# Patient Record
Sex: Male | Born: 1955 | Race: White | Hispanic: No | Marital: Single | Smoking: Current every day smoker
Health system: Southern US, Community
[De-identification: ages and names within clinical notes are randomized; demographics above are authoritative.]

## PROBLEM LIST (undated history)

## (undated) DIAGNOSIS — B1920 Unspecified viral hepatitis C without hepatic coma: Secondary | ICD-10-CM

## (undated) DIAGNOSIS — F329 Major depressive disorder, single episode, unspecified: Secondary | ICD-10-CM

## (undated) DIAGNOSIS — M545 Low back pain, unspecified: Secondary | ICD-10-CM

## (undated) DIAGNOSIS — F99 Mental disorder, not otherwise specified: Secondary | ICD-10-CM

## (undated) DIAGNOSIS — I10 Essential (primary) hypertension: Secondary | ICD-10-CM

## (undated) DIAGNOSIS — E119 Type 2 diabetes mellitus without complications: Secondary | ICD-10-CM

## (undated) DIAGNOSIS — N50812 Left testicular pain: Secondary | ICD-10-CM

## (undated) DIAGNOSIS — K7460 Unspecified cirrhosis of liver: Secondary | ICD-10-CM

## (undated) DIAGNOSIS — G629 Polyneuropathy, unspecified: Secondary | ICD-10-CM

## (undated) DIAGNOSIS — R109 Unspecified abdominal pain: Secondary | ICD-10-CM

## (undated) DIAGNOSIS — M1990 Unspecified osteoarthritis, unspecified site: Secondary | ICD-10-CM

## (undated) DIAGNOSIS — F32A Depression, unspecified: Secondary | ICD-10-CM

## (undated) DIAGNOSIS — F419 Anxiety disorder, unspecified: Secondary | ICD-10-CM

## (undated) HISTORY — DX: Low back pain: M54.5

## (undated) HISTORY — PX: APPENDECTOMY: SHX54

## (undated) HISTORY — DX: Unspecified cirrhosis of liver: K74.60

## (undated) HISTORY — DX: Left testicular pain: N50.812

## (undated) HISTORY — DX: Unspecified abdominal pain: R10.9

## (undated) HISTORY — DX: Low back pain, unspecified: M54.50

---

## 2012-10-08 ENCOUNTER — Emergency Department (HOSPITAL_COMMUNITY): Payer: Self-pay

## 2012-10-08 ENCOUNTER — Emergency Department (HOSPITAL_COMMUNITY)
Admission: EM | Admit: 2012-10-08 | Discharge: 2012-10-08 | Disposition: A | Discharge status: Home / Self Care | Payer: Self-pay | Attending: Emergency Medicine | Admitting: Emergency Medicine

## 2012-10-08 ENCOUNTER — Encounter (HOSPITAL_COMMUNITY): Payer: Self-pay | Admitting: *Deleted

## 2012-10-08 DIAGNOSIS — F17200 Nicotine dependence, unspecified, uncomplicated: Secondary | ICD-10-CM | POA: Insufficient documentation

## 2012-10-08 DIAGNOSIS — R1032 Left lower quadrant pain: Secondary | ICD-10-CM | POA: Insufficient documentation

## 2012-10-08 DIAGNOSIS — E119 Type 2 diabetes mellitus without complications: Secondary | ICD-10-CM | POA: Insufficient documentation

## 2012-10-08 DIAGNOSIS — Z8659 Personal history of other mental and behavioral disorders: Secondary | ICD-10-CM | POA: Insufficient documentation

## 2012-10-08 DIAGNOSIS — Z8739 Personal history of other diseases of the musculoskeletal system and connective tissue: Secondary | ICD-10-CM | POA: Insufficient documentation

## 2012-10-08 DIAGNOSIS — R109 Unspecified abdominal pain: Secondary | ICD-10-CM

## 2012-10-08 DIAGNOSIS — Z8669 Personal history of other diseases of the nervous system and sense organs: Secondary | ICD-10-CM | POA: Insufficient documentation

## 2012-10-08 DIAGNOSIS — I10 Essential (primary) hypertension: Secondary | ICD-10-CM | POA: Insufficient documentation

## 2012-10-08 HISTORY — DX: Type 2 diabetes mellitus without complications: E11.9

## 2012-10-08 HISTORY — DX: Unspecified osteoarthritis, unspecified site: M19.90

## 2012-10-08 HISTORY — DX: Essential (primary) hypertension: I10

## 2012-10-08 HISTORY — DX: Polyneuropathy, unspecified: G62.9

## 2012-10-08 LAB — CBC WITH DIFFERENTIAL/PLATELET
Basophils Relative: 1 % (ref 0–1)
Eosinophils Absolute: 0.5 10*3/uL (ref 0.0–0.7)
Eosinophils Relative: 5 % (ref 0–5)
HCT: 45.5 % (ref 39.0–52.0)
Hemoglobin: 17.1 g/dL — ABNORMAL HIGH (ref 13.0–17.0)
MCH: 33 pg (ref 26.0–34.0)
MCHC: 37.6 g/dL — ABNORMAL HIGH (ref 30.0–36.0)
MCV: 87.8 fL (ref 78.0–100.0)
Monocytes Absolute: 0.8 10*3/uL (ref 0.1–1.0)
Monocytes Relative: 7 % (ref 3–12)
RDW: 12 % (ref 11.5–15.5)

## 2012-10-08 LAB — COMPREHENSIVE METABOLIC PANEL
Albumin: 4.2 g/dL (ref 3.5–5.2)
BUN: 12 mg/dL (ref 6–23)
Calcium: 10.2 mg/dL (ref 8.4–10.5)
Chloride: 95 mEq/L — ABNORMAL LOW (ref 96–112)
Creatinine, Ser: 0.53 mg/dL (ref 0.50–1.35)
Total Bilirubin: 0.6 mg/dL (ref 0.3–1.2)

## 2012-10-08 LAB — URINALYSIS, ROUTINE W REFLEX MICROSCOPIC
Bilirubin Urine: NEGATIVE
Glucose, UA: >1000 mg/dL — AB
Ketones, ur: NEGATIVE mg/dL
Protein, ur: 30 mg/dL — AB
pH: 5.5 (ref 5.0–8.0)

## 2012-10-08 LAB — URINE MICROSCOPIC-ADD ON

## 2012-10-08 LAB — LIPASE, BLOOD: Lipase: 47 U/L (ref 11–59)

## 2012-10-08 MED ORDER — ONDANSETRON HCL 4 MG/2ML IJ SOLN
4.0000 mg | Freq: Once | INTRAMUSCULAR | Status: AC
  Administered 2012-10-08: 4 mg via INTRAVENOUS
  Filled 2012-10-08: qty 2

## 2012-10-08 MED ORDER — PROMETHAZINE HCL 25 MG PO TABS: 25.0000 mg | ORAL_TABLET | Freq: Four times a day (QID) | ORAL | Status: DC | PRN

## 2012-10-08 MED ORDER — HYDROMORPHONE HCL PF 1 MG/ML IJ SOLN
1.0000 mg | Freq: Once | INTRAMUSCULAR | Status: AC
  Administered 2012-10-08: 1 mg via INTRAVENOUS
  Filled 2012-10-08: qty 1

## 2012-10-08 MED ORDER — SODIUM CHLORIDE 0.9 % IV SOLN
INTRAVENOUS | Status: DC
  Administered 2012-10-08: 16:00:00 via INTRAVENOUS

## 2012-10-08 MED ORDER — OXYCODONE-ACETAMINOPHEN 5-325 MG PO TABS: 2.0000 | ORAL_TABLET | ORAL | Status: DC | PRN

## 2012-10-08 MED ORDER — FENTANYL CITRATE 0.05 MG/ML IJ SOLN
INTRAMUSCULAR | Status: AC
  Filled 2012-10-08: qty 2

## 2012-10-08 MED ORDER — OMEPRAZOLE 20 MG PO CPDR: 20.0000 mg | DELAYED_RELEASE_CAPSULE | Freq: Every day | ORAL | Status: DC

## 2012-10-08 MED ORDER — PANTOPRAZOLE SODIUM 40 MG IV SOLR
40.0000 mg | Freq: Once | INTRAVENOUS | Status: AC
  Administered 2012-10-08: 40 mg via INTRAVENOUS
  Filled 2012-10-08: qty 40

## 2012-10-08 MED ORDER — SODIUM CHLORIDE 0.9 % IV BOLUS (SEPSIS)
1000.0000 mL | Freq: Once | INTRAVENOUS | Status: AC
  Administered 2012-10-08: 1000 mL via INTRAVENOUS

## 2012-10-08 MED ORDER — IOHEXOL 300 MG/ML  SOLN
100.0000 mL | Freq: Once | INTRAMUSCULAR | Status: AC | PRN
  Administered 2012-10-08: 100 mL via INTRAVENOUS

## 2012-10-08 MED ORDER — FENTANYL CITRATE 0.05 MG/ML IJ SOLN
50.0000 ug | Freq: Once | INTRAMUSCULAR | Status: AC
  Administered 2012-10-08: 50 ug via INTRAVENOUS

## 2012-10-08 NOTE — ED Notes (Signed)
Patient reports a history of having left lower quadrant abdominal pain, states eating causes his pain to increase.  States he has lost over 40 pounds over a period of 3 months recently.  States he has has this problem ongoing for a period of 8 months.  States he has not followed up as he should have because of some problems at home and he does not have insurance and is unable to see a gastroenterologist.

## 2012-10-08 NOTE — ED Notes (Signed)
Pt describes his pain as intermittent, sharp and stabbing at times, states that the pain is always there, no acute distress noted at this time, vitals WDL.

## 2012-10-08 NOTE — ED Notes (Addendum)
Pain LLQ, low back and down lt leg.  For several mos. No NVD.  Problems with constipation. Has been losing wt.  Lost 40 lbs over 3 mos due to this illness.    Has been being seen in [facility] and  [facility].

## 2012-10-08 NOTE — ED Provider Notes (Signed)
History  This chart was scribed for Donnetta Hutching, MD by Shari Heritage, ED Scribe. The patient was seen in room APA19/APA19. Patient's care was started at 1413.  CSN: [id number]  Arrival date & time 10/08/12  1405   First MD Initiated Contact with Patient 10/08/12 1413      Chief Complaint  Patient presents with  . Abdominal Pain     The history is provided by the patient. No language interpreter was used.    HPI Comments: Samuel [name] is a 57 y.o. male who presents to the Emergency Department complaining of recurrent, moderate to severe LLQ abdominal pain for the past 3 months. There is associated constipation that began at the same time as abdominal pain. Patient states that he has been drinking a saline solution with mild relief from constipation. Patient thinks he noticed blood in his stool a couple of times and has had a few episodes of discolored urine. Patient has been evaluated for this problem once before. He was prescribed antibiotics by a clinic in [place] to treat recurrent abdominal pain. Patient says that he was dropped from his clinic after he missed several appointments while tending to his son with lung cancer. Patient also reports 40 lbs weight loss in past 3 months. Patient has a medical history of diabetes, hypertension, arthritis, neuropathy, and anxiety.     Past Medical History  Diagnosis Date  . Diabetes mellitus without complication   . Hypertension   . Neuropathy   . Arthritis     Past Surgical History  Procedure Date  . Appendectomy     History reviewed. No pertinent family history.  History  Substance Use Topics  . Smoking status: Current Every Day Smoker  . Smokeless tobacco: Not on file  . Alcohol Use: No      Review of Systems A complete 10 system review of systems was obtained and all systems are negative except as noted in the HPI and PMH.   Allergies  Review of patient's allergies indicates no known allergies.  Home Medications  No  current outpatient prescriptions on file.  Triage Vitals: BP 150/122  Pulse 128  Temp 98.2 F (36.8 C) (Oral)  Resp 20  Ht 6\' 4"  (1.93 m)  Wt 195 lb (88.451 kg)  BMI 23.74 kg/m2  SpO2 99%  Physical Exam  Nursing note and vitals reviewed. Constitutional: He is oriented to person, place, and time. He appears well-developed and well-nourished.  HENT:  Head: Normocephalic and atraumatic.  Eyes: Conjunctivae normal and EOM are normal. Pupils are equal, round, and reactive to light.  Neck: Normal range of motion. Neck supple.  Cardiovascular: Normal rate, regular rhythm and normal heart sounds.   Pulmonary/Chest: Effort normal and breath sounds normal.  Abdominal: Soft. Bowel sounds are normal. There is tenderness in the left lower quadrant.  Musculoskeletal: Normal range of motion.  Neurological: He is alert and oriented to person, place, and time.  Skin: Skin is warm and dry.  Psychiatric: He has a normal mood and affect.    ED Course  Procedures (including critical care time) DIAGNOSTIC STUDIES: Oxygen Saturation is 99% on room air, normal by my interpretation.    COORDINATION OF CARE: 2:38 PM- Patient informed of current plan for treatment and evaluation and agrees with plan at this time. Will order CT scan w contrast and blood work, and administer IV fluids, dilaudid and zofran.  Results for orders placed during the hospital encounter of 10/08/12  URINALYSIS, ROUTINE W REFLEX MICROSCOPIC  Component Value Range   Color, Urine YELLOW  YELLOW   APPearance CLEAR  CLEAR   Specific Gravity, Urine 1.010  1.005 - 1.030   pH 5.5  5.0 - 8.0   Glucose, UA >1000 (*) NEGATIVE mg/dL   Hgb urine dipstick TRACE (*) NEGATIVE   Bilirubin Urine NEGATIVE  NEGATIVE   Ketones, ur NEGATIVE  NEGATIVE mg/dL   Protein, ur 30 (*) NEGATIVE mg/dL   Urobilinogen, UA 0.2  0.0 - 1.0 mg/dL   Nitrite NEGATIVE  NEGATIVE   Leukocytes, UA NEGATIVE  NEGATIVE  CBC WITH DIFFERENTIAL      Component  Value Range   WBC 10.2  4.0 - 10.5 K/uL   RBC 5.18  4.22 - 5.81 MIL/uL   Hemoglobin 17.1 (*) 13.0 - 17.0 g/dL   HCT 95.6  21.3 - 08.6 %   MCV 87.8  78.0 - 100.0 fL   MCH 33.0  26.0 - 34.0 pg   MCHC 37.6 (*) 30.0 - 36.0 g/dL   RDW 57.8  46.9 - 62.9 %   Platelets 187  150 - 400 K/uL   Neutrophils Relative 54  43 - 77 %   Neutro Abs 5.5  1.7 - 7.7 K/uL   Lymphocytes Relative 33  12 - 46 %   Lymphs Abs 3.4  0.7 - 4.0 K/uL   Monocytes Relative 7  3 - 12 %   Monocytes Absolute 0.8  0.1 - 1.0 K/uL   Eosinophils Relative 5  0 - 5 %   Eosinophils Absolute 0.5  0.0 - 0.7 K/uL   Basophils Relative 1  0 - 1 %   Basophils Absolute 0.1  0.0 - 0.1 K/uL  COMPREHENSIVE METABOLIC PANEL      Component Value Range   Sodium 133 (*) 135 - 145 mEq/L   Potassium 4.1  3.5 - 5.1 mEq/L   Chloride 95 (*) 96 - 112 mEq/L   CO2 28  19 - 32 mEq/L   Glucose, Bld 349 (*) 70 - 99 mg/dL   BUN 12  6 - 23 mg/dL   Creatinine, Ser 5.28  0.50 - 1.35 mg/dL   Calcium 41.3  8.4 - 24.4 mg/dL   Total Protein 8.1  6.0 - 8.3 g/dL   Albumin 4.2  3.5 - 5.2 g/dL   AST 57 (*) 0 - 37 U/L   ALT 111 (*) 0 - 53 U/L   Alkaline Phosphatase 79  39 - 117 U/L   Total Bilirubin 0.6  0.3 - 1.2 mg/dL   GFR calc non Af Amer >90  >90 mL/min   GFR calc Af Amer >90  >90 mL/min  URINE MICROSCOPIC-ADD ON      Component Value Range   Squamous Epithelial / LPF RARE  RARE   WBC, UA 0-2  <3 WBC/hpf   RBC / HPF 0-2  <3 RBC/hpf   Bacteria, UA RARE  RARE  LIPASE, BLOOD      Component Value Range   Lipase 47  11 - 59 U/L    Ct Abdomen Pelvis W Contrast  10/08/2012  *RADIOLOGY REPORT*  Clinical Data: Right lower quadrant abdominal pain.  Weight loss.  CT ABDOMEN AND PELVIS WITH CONTRAST  Technique:  Multidetector CT imaging of the abdomen and pelvis was performed following the standard protocol during bolus administration of intravenous contrast.  Contrast:  100 ml of Omnipaque-300.  Comparison: No priors.  Findings:  Lung Bases: Unremarkable.   Abdomen/Pelvis:  Calcification in the left lobe of  the liver likely represents a small calcified granuloma. The liver also has a very subtle nodular contour to it, which could be indicative of very early cirrhosis.  No definite focal hepatic lesions are noted at this time.  The appearance of the gallbladder, pancreas, spleen and bilateral adrenal glands is unremarkable.  There are multiple subcentimeter low attenuation lesions in the kidneys bilaterally which are too small to definitively characterize.  In addition, there are a couple of larger low attenuation lesions in the left kidney which are compatible with simple cysts, the largest of which measure only 1.5 cm in the upper pole. Extensive atherosclerosis throughout the abdominal and pelvic vasculature, without definite aneurysm or dissection.  Profound thickening of the duodenum is noted extending from the distal aspect of the second segment all the way through the level of the ligament of Treitz.  No definite surrounding inflammatory changes are identified at this time.  There are numerous prominent reactive sized upper abdominal and retroperitoneal lymph nodes, but no definite pathologic nodal enlargement.  Numerous colonic diverticula are identified, without surrounding inflammatory changes to suggest an acute diverticulitis at this time.  No ascites or pneumoperitoneum and no pathologic distension of bowel. Urinary bladder is moderately distended, but otherwise unremarkable in appearance.  Small bilateral inguinal hernias containing only fat. 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  Musculoskeletal: There are no aggressive appearing lytic or blastic lesions noted in the visualized portions of the skeleton.  IMPRESSION: 1.  Marked thickening of the duodenal wall which appears to be circumferential.  No definite mass is identified.  This finding is of uncertain etiology and significance.  This could  represent a duodenitis, however, there are no overt surrounding inflammatory changes noted at this time.  Alternatively, an infiltrative neoplasm such as lymphoma would be difficult to entirely exclude. Correlation with endoscopy may be warranted if clinically indicated. 2.  Very subtle nodular contour of the liver could suggest early changes of cirrhosis. 3.  Colonic diverticulosis without findings to suggest an acute diverticulitis at this time. 4.  Extensive atherosclerosis.   Original Report Authenticated By: Trudie Reed, M.D.      No results found.   No diagnosis found.    MDM  No acute abdomen.pulse is normalized.elevation glucose, AST, ALT noted.   Results of CT scan discussed with patient, his girlfriend, and gastroenterologist.   I offered to admit the patient but he preferred to go home.   Discharge meds Percocet #25, Phenergan 25 mg #20 and Prilosec 20 mg #30      I personally performed the services described in this documentation, which was scribed in my presence. The recorded information has been reviewed and is accurate.    Donnetta Hutching, MD 10/08/12 2136

## 2012-10-09 ENCOUNTER — Telehealth: Payer: Self-pay | Admitting: Gastroenterology

## 2012-10-09 NOTE — Telephone Encounter (Signed)
CONTACT PT FOR APPT WITHIN 7 DAYS FOR ABD PAIN/ABNL DUODENUM ON CT. EVENTUALLY NEED PUSH ENTEROSCOPY.

## 2012-10-09 NOTE — Telephone Encounter (Signed)
Pt has OV on 1/22 at 2 with AS

## 2012-10-22 ENCOUNTER — Encounter: Payer: Self-pay | Admitting: Gastroenterology

## 2012-10-22 ENCOUNTER — Ambulatory Visit (INDEPENDENT_AMBULATORY_CARE_PROVIDER_SITE_OTHER): Payer: Self-pay | Admitting: Gastroenterology

## 2012-10-22 VITALS — BP 136/93 | HR 115 | Temp 98.6°F | Ht 76.0 in | Wt 201.6 lb

## 2012-10-22 DIAGNOSIS — N50812 Left testicular pain: Secondary | ICD-10-CM

## 2012-10-22 DIAGNOSIS — B1920 Unspecified viral hepatitis C without hepatic coma: Secondary | ICD-10-CM

## 2012-10-22 DIAGNOSIS — R109 Unspecified abdominal pain: Secondary | ICD-10-CM

## 2012-10-22 DIAGNOSIS — N509 Disorder of male genital organs, unspecified: Secondary | ICD-10-CM

## 2012-10-22 DIAGNOSIS — K7460 Unspecified cirrhosis of liver: Secondary | ICD-10-CM

## 2012-10-22 MED ORDER — HYDROCODONE-ACETAMINOPHEN 5-500 MG PO TABS: 1.0000 | ORAL_TABLET | ORAL | Status: DC | PRN

## 2012-10-22 NOTE — Progress Notes (Signed)
Primary Care Physician:  No primary provider on file. Primary Gastroenterologist:  Dr. Darrick Penna   Chief Complaint  Patient presents with  . Abdominal Pain    HPI:   57 year old male presenting as an ED referral. Seen in ED recently for LLQ pain, constipation, wt loss. CT noted marked thickening of the duodenal wall, circumferential. No definite mass noted. Questionable duodenitis in differential but unable to exclude neoplasm. Possible early cirrhosis of liver due to subtle nodular changes, colonic diverticulosis but no diverticulitis.   Pt presents noting a 3 month hx of severe LLQ/groin pain, new changes in bowel habits causing constipation. Notes pain is constant. Pain level stays around 10. Right now 8 or 9. No fever or chills. Taking magnesium citrate, which works in 6-8 hours. If he doesn't drink it, no BM. Lost over 40 lbs in 3 months. If he eats anything solid, "tears him up", causing pain. Can't sleep more than an hour at a time due to pain. Remote hx of hematochezia. No upper abdominal pain. No vomiting, intermittent nausea. No GERD since quit eating. Eating very bland foods.   No prior colonoscopy. No prior upper endoscopy. Hx of Hep C. Notes he has never been treated for this, but he lost his space at the [facility]. Reported 2 years ago. Son age 94, passed away in Aug 18, 2023 from metastatic lung cancer. Notes hx of ETOH abuse in remote past, abstinent for past 6-7 years. No prior blood transfusion, no tattoos. 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   Pt also notes chronic "twisting" "vice-like" pain of left testicle, no edema.   Past Medical History  Diagnosis Date  . Diabetes mellitus without complication   . Hypertension   . Neuropathy   . Arthritis   . Lower back pain     Past Surgical History  Procedure Date  . Appendectomy     as child    Current Outpatient Prescriptions  Medication Sig Dispense Refill  . amLODipine (NORVASC) 5 MG tablet Take 5 mg by mouth daily.      Marland Kitchen  aspirin EC 81 MG tablet Take 81 mg by mouth daily.      . ciprofloxacin (CIPRO) 500 MG tablet Take 500 mg by mouth 2 (two) times daily.      Marland Kitchen glipiZIDE (GLUCOTROL) 10 MG tablet Take 10 mg by mouth 2 (two) times daily.      Marland Kitchen lisinopril (PRINIVIL,ZESTRIL) 20 MG tablet Take 20 mg by mouth daily.      . Menthol-Methyl Salicylate (ICY HOT EXTRA STRENGTH) 10-30 % CREA Apply 1 application topically daily as needed. For pain      . metFORMIN (GLUCOPHAGE) 500 MG tablet Take 500 mg by mouth 2 (two) times daily.      . metroNIDAZOLE (FLAGYL) 500 MG tablet Take 500 mg by mouth 2 (two) times daily.      . mineral oil liquid Take 30-45 mLs by mouth daily as needed. For bowel movements      . omeprazole (PRILOSEC) 20 MG capsule Take 1 capsule (20 mg total) by mouth daily.  30 capsule  0  . HYDROcodone-acetaminophen (VICODIN) 5-500 MG per tablet Take 1 tablet by mouth every 4 (four) hours as needed for pain.  20 tablet  0    Allergies as of 10/22/2012  . (No Known Allergies)    Family History  Problem Relation Age of Onset  . Colon cancer Neg Hx     History   Social History  . Marital  Status: Divorced    Spouse Name: N/A    Number of Children: N/A  . Years of Education: N/A   Occupational History  . Not on file.   Social History Main Topics  . Smoking status: Current Every Day Smoker -- 0.5 packs/day    Types: Cigarettes  . Smokeless tobacco: Not on file  . Alcohol Use: No     Comment: prior alcoholic  . Drug Use: No  . Sexually Active: Not on file   Other Topics Concern  . Not on file   Social History Narrative  . No narrative on file    Review of Systems: Gen: SEE HPI CV: Denies chest pain, heart palpitations, peripheral edema, syncope.  Resp: +DOE GI: SEE HPI GU : +urinary hesitancy, difficult to get started, noted as dark MS: SEE HPI Derm: Denies rash, itching, dry skin Psych: +depression/anxiety Heme: Denies bruising, bleeding, and enlarged lymph nodes.  Physical  Exam: BP 136/93  Pulse 115  Temp 98.6 F (37 C) (Oral)  Ht 6\' 4"  (1.93 m)  Wt 201 lb 9.6 oz (91.445 kg)  BMI 24.54 kg/m2 General:   Alert and oriented. Pleasant and cooperative. Well-nourished and well-developed.  Head:  Normocephalic and atraumatic. Eyes:  Without icterus, sclera clear and conjunctiva pink.  Ears:  Normal auditory acuity. Nose:  No deformity, discharge,  or lesions. Mouth:  No deformity or lesions, oral mucosa pink.  Neck:  Supple, without mass or thyromegaly. Lungs:  Clear to auscultation bilaterally. No wheezes, rales, or rhonchi. No distress.  Heart:  S1, S2 present without murmurs appreciated.  Abdomen:  +BS, soft, LLQ TTP and non-distended. No HSM noted. No guarding or rebound. No masses appreciated.  Rectal:  Deferred  Msk:  Symmetrical without gross deformities. Normal posture. Extremities:  Without clubbing or edema. Neurologic:  Alert and  oriented x4;  grossly normal neurologically. Skin:  Intact without significant lesions or rashes. Cervical Nodes:  No significant cervical adenopathy. Psych:  Alert and cooperative. Normal mood and affect.

## 2012-10-22 NOTE — Patient Instructions (Addendum)
Start taking Linzess each morning, 30 minutes before breakfast. This is for constipation.  I have ordered bloodwork to be done once the cone assistance is completed.   I have also ordered an ultrasound of your scrotum and a colonoscopy/upper endoscopy.   Please seek urgent medical attention if any of your symptoms worsen.  U/S is scheduled for Friday Jan 24th at 10:00 am and the patient is aware  Patient has been scheduled for TCS/EGD with Propofol w/SLF on Tuesday 11/04/12 and he is aware/LW

## 2012-10-23 ENCOUNTER — Telehealth: Payer: Self-pay

## 2012-10-23 DIAGNOSIS — R109 Unspecified abdominal pain: Secondary | ICD-10-CM | POA: Insufficient documentation

## 2012-10-23 DIAGNOSIS — N50812 Left testicular pain: Secondary | ICD-10-CM

## 2012-10-23 DIAGNOSIS — B1920 Unspecified viral hepatitis C without hepatic coma: Secondary | ICD-10-CM | POA: Insufficient documentation

## 2012-10-23 DIAGNOSIS — K7460 Unspecified cirrhosis of liver: Secondary | ICD-10-CM

## 2012-10-23 HISTORY — DX: Left testicular pain: N50.812

## 2012-10-23 HISTORY — DX: Unspecified abdominal pain: R10.9

## 2012-10-23 HISTORY — DX: Unspecified cirrhosis of liver: K74.60

## 2012-10-23 MED ORDER — LINACLOTIDE 145 MCG PO CAPS: 145.0000 ug | ORAL_CAPSULE | Freq: Every day | ORAL | Status: DC

## 2012-10-23 NOTE — Assessment & Plan Note (Signed)
Possible early cirrhosis noted on CT. Pt with hx ETOH abuse in past but sober for at least 6-7 years. Reportedly Hep C positive, no prior treatment. 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 Will check AFP today, PT/INR. EGD in near future to assess for varices at time of TCS. Needs Hep A and B vaccinations.

## 2012-10-23 NOTE — Assessment & Plan Note (Signed)
Noted hx of Hep C, not treated. Likely contributing to possible cirrhosis, hx of ETOH abuse as well. Check RNA and genotype. AFP today. Refer for treatment in future.

## 2012-10-23 NOTE — Telephone Encounter (Signed)
T/C from [place] at [facility] in [place]. She said they no longer have the Vicodin 5/500 and they have to do the 5/325 now. I told her I will check with Gerrit Halls, NP and give her a call back. ( Sending pager message to Tobi Bastos since she is at the hospital this morning).

## 2012-10-23 NOTE — Telephone Encounter (Signed)
Yes this is fine.

## 2012-10-23 NOTE — Assessment & Plan Note (Signed)
57 year old male with LLQ/groin pain and change in bowel habits to significant constipation. Wt loss of 40 lbs reportedly. Associated nausea, remote hx of hematochezia. CT on file without evidence of diverticulitis. HOWEVER, marked thickening of duodenal wall, circumferential noted. Possible duodenitis but unable to exclude neoplasm. Pt has never had an upper endoscopy or colonoscopy.   Start Linzess 145 mcg daily. Samples provided.  Proceed with colonoscopy and upper endoscopy with Dr. Darrick Penna in the near future. The risks, benefits, and alternatives have been discussed in detail with the patient. They state understanding and desire to proceed.  PROPOFOL DUE TO HX OF ETOH ABUSE At time of consultation, pending [place] assistance.  To ED if worsening of symptoms.

## 2012-10-23 NOTE — Telephone Encounter (Signed)
Routing to Ginger who talked to the pharmacist.

## 2012-10-23 NOTE — Assessment & Plan Note (Signed)
Pt denies edema, but notes chronic "vice-like" discomfort. [place] ordered. To ED if severe pain, edema.

## 2012-10-23 NOTE — Telephone Encounter (Signed)
I called Vickie and told her it was ok to give him 5-325 per AS

## 2012-10-23 NOTE — Progress Notes (Signed)
No PCP on file 

## 2012-10-24 ENCOUNTER — Telehealth: Payer: Self-pay

## 2012-10-24 ENCOUNTER — Other Ambulatory Visit: Payer: Self-pay | Admitting: Gastroenterology

## 2012-10-24 ENCOUNTER — Ambulatory Visit (HOSPITAL_COMMUNITY)
Admission: RE | Admit: 2012-10-24 | Discharge: 2012-10-24 | Disposition: A | Discharge status: Home / Self Care | Payer: Self-pay | Source: Ambulatory Visit | Attending: Gastroenterology | Admitting: Gastroenterology

## 2012-10-24 DIAGNOSIS — N50812 Left testicular pain: Secondary | ICD-10-CM

## 2012-10-24 DIAGNOSIS — N509 Disorder of male genital organs, unspecified: Secondary | ICD-10-CM | POA: Insufficient documentation

## 2012-10-24 DIAGNOSIS — N433 Hydrocele, unspecified: Secondary | ICD-10-CM | POA: Insufficient documentation

## 2012-10-24 NOTE — Telephone Encounter (Signed)
Routing again to Gerrit Halls, NP.

## 2012-10-24 NOTE — Telephone Encounter (Signed)
PLEASE CALL [facility] radiology. I did not order a US OF THE SCROTUM. THEY NEED TO NOTIFY THE MD WHO ORDERED THE TEST.

## 2012-10-24 NOTE — Telephone Encounter (Signed)
The test was ordered by Gerrit Halls, NP with Jonette Eva as the Primary Gastroenterologist. The message was routed to Dr. Darrick Penna in the absence of Gerrit Halls, NP, today.

## 2012-10-24 NOTE — Telephone Encounter (Signed)
FORWARD TO ANNA SAMS.

## 2012-10-24 NOTE — Telephone Encounter (Signed)
T/c from [place] at [facility]. Pt's [place] of Scrotum showed small bilateral hydroceles and otherwise unremarkable.

## 2012-10-27 ENCOUNTER — Other Ambulatory Visit: Payer: Self-pay | Admitting: Gastroenterology

## 2012-10-27 ENCOUNTER — Telehealth: Payer: Self-pay | Admitting: Gastroenterology

## 2012-10-27 ENCOUNTER — Encounter (HOSPITAL_COMMUNITY): Payer: Self-pay | Admitting: Pharmacy Technician

## 2012-10-27 MED ORDER — PEG 3350-KCL-NA BICARB-NACL 420 G PO SOLR: 4000.0000 mL | ORAL | Status: DC

## 2012-10-27 NOTE — Telephone Encounter (Signed)
I ordered it due to complaints of scrotal pain.  I have put a result note in epic: no significant abnormalities.

## 2012-10-27 NOTE — Telephone Encounter (Signed)
See note

## 2012-10-27 NOTE — Telephone Encounter (Signed)
Called and informed pt. He said he is having a little BM daily since he started the Linzess once a day. It is not what it should be, but it is some of a BM daily. He said he understands about the pain med  It is just that he has hurt so in his testicles for 3 months and said he has lost 40 lbs recently and he can't sleep at night.

## 2012-10-27 NOTE — Telephone Encounter (Signed)
Called pt in reference to his [place] report. He said the pain medication is not helping and he is in a lot of pain. Please advise!

## 2012-10-27 NOTE — Progress Notes (Signed)
Quick Note:  LATE ENTRY: Called pt this Am and gave results of the [place]. ( See separate phone note in reference to pain meds ). ______

## 2012-10-27 NOTE — Telephone Encounter (Signed)
Patient is calling asking for more pain meds he states that he is still in a lot of pain and he does not have any more medication, he is scheduled for TCS/EGD with Propofol w/SLF on 02/04

## 2012-10-27 NOTE — Telephone Encounter (Signed)
Will not provide any more pain medication. When was last BM? Is he taking Linzess yet?

## 2012-10-28 NOTE — Telephone Encounter (Signed)
Pt returned call. He said that he feels the Linzess 145 mcg is working well enough. He has some BM daily, but he is not eating real good. He does not have a PCP and I encouraged him to find one. He asked again, " Will she not give me anything for pain", and I told him, Tobi Bastos said No. He said " well, that's Ok, I'll just end up in the ED again.  "

## 2012-10-28 NOTE — Telephone Encounter (Signed)
Pt was made aware yesterday

## 2012-10-28 NOTE — Telephone Encounter (Signed)
Called. LMOM for a return call. ( No samples of Linzess 290 mcg) Will see if pt wants a prescription.

## 2012-10-28 NOTE — Progress Notes (Signed)
Quick Note:  Ultrasound of scrotum without acute issues. ______

## 2012-10-28 NOTE — Telephone Encounter (Signed)
He can trial Linzess 290 mcg daily. I had given him 145 mcg daily. This may help with more productive BMs. Does he have a PCP? He really needs to get established with a primary care provider. His CT showed bilateral inguinal hernias but only fat-containing. This shouldn't be causing the discomfort.

## 2012-10-29 ENCOUNTER — Encounter (HOSPITAL_COMMUNITY): Admission: RE | Admit: 2012-10-29 | Payer: Self-pay | Source: Ambulatory Visit

## 2012-10-30 NOTE — Telephone Encounter (Signed)
Noted  

## 2012-10-31 NOTE — Patient Instructions (Addendum)
TREMAR WICKENS  10/31/2012   Your procedure is scheduled on:  11/04/12  Report to [facility] at [phone] AM.  Call this number if you have problems the morning of surgery: [phone]   Remember:   Do not eat food or drink liquids after midnight.   Take these medicines the morning of surgery with A SIP OF WATER: prilosec, norvasc, lisinopril   Do not wear jewelry, make-up or nail polish.  Do not wear lotions, powders, or perfumes. You may wear deodorant.  Do not shave 48 hours prior to surgery. Men may shave face and neck.  Do not bring valuables to the hospital.  Contacts, dentures or bridgework may not be worn into surgery.  Leave suitcase in the car. After surgery it may be brought to your room.  For patients admitted to the hospital, checkout time is 11:00 AM the day of  discharge.   Patients discharged the day of surgery will not be allowed to drive  home.  Name and phone number of your driver: family  Special Instructions: N/A   Please read over the following fact sheets that you were given: Anesthesia Post-op Instructions and Care and Recovery After Surgery   PATIENT INSTRUCTIONS POST-ANESTHESIA  IMMEDIATELY FOLLOWING SURGERY:  Do not drive or operate machinery for the first twenty four hours after surgery.  Do not make any important decisions for twenty four hours after surgery or while taking narcotic pain medications or sedatives.  If you develop intractable nausea and vomiting or a severe headache please notify your doctor immediately.  FOLLOW-UP:  Please make an appointment with your surgeon as instructed. You do not need to follow up with anesthesia unless specifically instructed to do so.  WOUND CARE INSTRUCTIONS (if applicable):  Keep a dry clean dressing on the anesthesia/puncture wound site if there is drainage.  Once the wound has quit draining you may leave it open to air.  Generally you should leave the bandage intact for twenty four hours unless there is drainage.  If the  epidural site drains for more than 36-48 hours please call the anesthesia department.  QUESTIONS?:  Please feel free to call your physician or the hospital operator if you have any questions, and they will be happy to assist you.      Esophagogastroduodenoscopy This is an endoscopic procedure (a procedure that uses a device like a flexible telescope) that allows your caregiver to view the upper stomach and small bowel. This test allows your caregiver to look at the esophagus. The esophagus carries food from your mouth to your stomach. They can also look at your duodenum. This is the first part of the small intestine that attaches to the stomach. This test is used to detect problems in the bowel such as ulcers and inflammation. PREPARATION FOR TEST Nothing to eat after midnight the day before the test. NORMAL FINDINGS Normal esophagus, stomach, and duodenum. Ranges for normal findings may vary among different laboratories and hospitals. You should always check with your doctor after having lab work or other tests done to discuss the meaning of your test results and whether your values are considered within normal limits. MEANING OF TEST  Your caregiver will go over the test results with you and discuss the importance and meaning of your results, as well as treatment options and the need for additional tests if necessary. OBTAINING THE TEST RESULTS It is your responsibility to obtain your test results. Ask the lab or department performing the test when and  how you will get your results. Document Released: 01/18/2005 Document Revised: 12/10/2011 Document Reviewed: 08/27/2008 [facility] Patient Information 2013 [place], [place]. Colonoscopy A colonoscopy is an exam to evaluate your entire colon. In this exam, your colon is cleansed. A long fiberoptic tube is inserted through your rectum and into your colon. The fiberoptic scope (endoscope) is a long bundle of enclosed and very flexible fibers. These  fibers transmit light to the area examined and send images from that area to your caregiver. Discomfort is usually minimal. You may be given a drug to help you sleep (sedative) during or prior to the procedure. This exam helps to detect lumps (tumors), polyps, inflammation, and areas of bleeding. Your caregiver may also take a small piece of tissue (biopsy) that will be examined under a microscope. LET YOUR CAREGIVER KNOW ABOUT:   Allergies to food or medicine.  Medicines taken, including vitamins, herbs, eyedrops, over-the-counter medicines, and creams.  Use of steroids (by mouth or creams).  Previous problems with anesthetics or numbing medicines.  History of bleeding problems or blood clots.  Previous surgery.  Other health problems, including diabetes and kidney problems.  Possibility of pregnancy, if this applies. BEFORE THE PROCEDURE   A clear liquid diet may be required for 2 days before the exam.  Ask your caregiver about changing or stopping your regular medications.  Liquid injections (enemas) or laxatives may be required.  A large amount of electrolyte solution may be given to you to drink over a short period of time. This solution is used to clean out your colon.  You should be present 60 minutes prior to your procedure or as directed by your caregiver. AFTER THE PROCEDURE   If you received a sedative or pain relieving medication, you will need to arrange for someone to drive you home.  Occasionally, there is a little blood passed with the first bowel movement. Do not be concerned. FINDING OUT THE RESULTS OF YOUR TEST Not all test results are available during your visit. If your test results are not back during the visit, make an appointment with your caregiver to find out the results. Do not assume everything is normal if you have not heard from your caregiver or the medical facility. It is important for you to follow up on all of your test results. HOME CARE  INSTRUCTIONS   It is not unusual to pass moderate amounts of gas and experience mild abdominal cramping following the procedure. This is due to air being used to inflate your colon during the exam. Walking or a warm pack on your belly (abdomen) may help.  You may resume all normal meals and activities after sedatives and medicines have worn off.  Only take over-the-counter or prescription medicines for pain, discomfort, or fever as directed by your caregiver. Do not use aspirin or blood thinners if a biopsy was taken. Consult your caregiver for medicine usage if biopsies were taken. SEEK IMMEDIATE MEDICAL CARE IF:   You have a fever.  You pass large blood clots or fill a toilet with blood following the procedure. This may also occur 10 to 14 days following the procedure. This is more likely if a biopsy was taken.  You develop abdominal pain that keeps getting worse and cannot be relieved with medicine. Document Released: 09/14/2000 Document Revised: 12/10/2011 Document Reviewed: 04/29/2008 [facility] Patient Information 2013 [place], [place].

## 2012-11-03 ENCOUNTER — Other Ambulatory Visit: Payer: Self-pay

## 2012-11-03 ENCOUNTER — Encounter (HOSPITAL_COMMUNITY)
Admission: RE | Admit: 2012-11-03 | Discharge: 2012-11-03 | Disposition: A | Discharge status: Home / Self Care | Payer: Self-pay | Source: Ambulatory Visit | Attending: Gastroenterology | Admitting: Gastroenterology

## 2012-11-03 ENCOUNTER — Telehealth: Payer: Self-pay

## 2012-11-03 ENCOUNTER — Encounter (HOSPITAL_COMMUNITY): Payer: Self-pay

## 2012-11-03 HISTORY — DX: Anxiety disorder, unspecified: F41.9

## 2012-11-03 HISTORY — DX: Depression, unspecified: F32.A

## 2012-11-03 HISTORY — DX: Unspecified viral hepatitis C without hepatic coma: B19.20

## 2012-11-03 HISTORY — DX: Major depressive disorder, single episode, unspecified: F32.9

## 2012-11-03 HISTORY — DX: Mental disorder, not otherwise specified: F99

## 2012-11-03 LAB — BASIC METABOLIC PANEL
BUN: 13 mg/dL (ref 6–23)
CO2: 26 mEq/L (ref 19–32)
Chloride: 95 mEq/L — ABNORMAL LOW (ref 96–112)
Glucose, Bld: 372 mg/dL — ABNORMAL HIGH (ref 70–99)
Potassium: 4.1 mEq/L (ref 3.5–5.1)

## 2012-11-03 LAB — PROTIME-INR: Prothrombin Time: 13.4 seconds (ref 11.6–15.2)

## 2012-11-03 NOTE — Telephone Encounter (Signed)
Pt came by with questions about his prep Soledad Gerlach is taking care of that). He also requested samples of Linzess 145 mcg and samples of that were given.

## 2012-11-04 ENCOUNTER — Ambulatory Visit (HOSPITAL_COMMUNITY): Payer: Self-pay | Admitting: Anesthesiology

## 2012-11-04 ENCOUNTER — Encounter (HOSPITAL_COMMUNITY): Payer: Self-pay | Admitting: Anesthesiology

## 2012-11-04 ENCOUNTER — Encounter (HOSPITAL_COMMUNITY): Payer: Self-pay | Admitting: *Deleted

## 2012-11-04 ENCOUNTER — Encounter (HOSPITAL_COMMUNITY)
Admission: RE | Disposition: A | Discharge status: Home Health | Payer: Self-pay | Source: Ambulatory Visit | Attending: Gastroenterology

## 2012-11-04 ENCOUNTER — Ambulatory Visit (HOSPITAL_COMMUNITY)
Admission: RE | Admit: 2012-11-04 | Discharge: 2012-11-04 | Disposition: A | Discharge status: Home Health | Payer: Self-pay | Source: Ambulatory Visit | Attending: Gastroenterology | Admitting: Gastroenterology

## 2012-11-04 DIAGNOSIS — I10 Essential (primary) hypertension: Secondary | ICD-10-CM | POA: Insufficient documentation

## 2012-11-04 DIAGNOSIS — K2980 Duodenitis without bleeding: Secondary | ICD-10-CM

## 2012-11-04 DIAGNOSIS — K648 Other hemorrhoids: Secondary | ICD-10-CM

## 2012-11-04 DIAGNOSIS — Z1211 Encounter for screening for malignant neoplasm of colon: Secondary | ICD-10-CM | POA: Insufficient documentation

## 2012-11-04 DIAGNOSIS — K2940 Chronic atrophic gastritis without bleeding: Secondary | ICD-10-CM | POA: Insufficient documentation

## 2012-11-04 DIAGNOSIS — K7460 Unspecified cirrhosis of liver: Secondary | ICD-10-CM | POA: Insufficient documentation

## 2012-11-04 DIAGNOSIS — Z79899 Other long term (current) drug therapy: Secondary | ICD-10-CM | POA: Insufficient documentation

## 2012-11-04 DIAGNOSIS — K2990 Gastroduodenitis, unspecified, without bleeding: Secondary | ICD-10-CM

## 2012-11-04 DIAGNOSIS — K5730 Diverticulosis of large intestine without perforation or abscess without bleeding: Secondary | ICD-10-CM

## 2012-11-04 DIAGNOSIS — K222 Esophageal obstruction: Secondary | ICD-10-CM

## 2012-11-04 DIAGNOSIS — Z01812 Encounter for preprocedural laboratory examination: Secondary | ICD-10-CM | POA: Insufficient documentation

## 2012-11-04 DIAGNOSIS — K2970 Gastritis, unspecified, without bleeding: Secondary | ICD-10-CM

## 2012-11-04 DIAGNOSIS — E119 Type 2 diabetes mellitus without complications: Secondary | ICD-10-CM | POA: Insufficient documentation

## 2012-11-04 DIAGNOSIS — D126 Benign neoplasm of colon, unspecified: Secondary | ICD-10-CM

## 2012-11-04 HISTORY — PX: ESOPHAGOGASTRODUODENOSCOPY (EGD) WITH PROPOFOL: SHX5813

## 2012-11-04 HISTORY — PX: POLYPECTOMY: SHX5525

## 2012-11-04 HISTORY — PX: COLONOSCOPY WITH PROPOFOL: SHX5780

## 2012-11-04 HISTORY — PX: BIOPSY: SHX5522

## 2012-11-04 LAB — GLUCOSE, CAPILLARY
Glucose-Capillary: 320 mg/dL — ABNORMAL HIGH (ref 70–99)
Glucose-Capillary: 344 mg/dL — ABNORMAL HIGH (ref 70–99)

## 2012-11-04 SURGERY — COLONOSCOPY WITH PROPOFOL
Anesthesia: Monitor Anesthesia Care

## 2012-11-04 MED ORDER — FENTANYL CITRATE 0.05 MG/ML IJ SOLN
INTRAMUSCULAR | Status: AC
  Filled 2012-11-04: qty 2

## 2012-11-04 MED ORDER — FENTANYL CITRATE 0.05 MG/ML IJ SOLN
25.0000 ug | INTRAMUSCULAR | Status: DC | PRN
  Administered 2012-11-04 (×2): 25 ug via INTRAVENOUS

## 2012-11-04 MED ORDER — INSULIN REGULAR HUMAN 100 UNIT/ML IJ SOLN
5.0000 [IU] | Freq: Once | INTRAMUSCULAR | Status: AC
  Administered 2012-11-04: 5 [IU] via SUBCUTANEOUS
  Filled 2012-11-04: qty 0.05

## 2012-11-04 MED ORDER — ONDANSETRON HCL 4 MG/2ML IJ SOLN
4.0000 mg | Freq: Once | INTRAMUSCULAR | Status: AC
  Administered 2012-11-04: 4 mg via INTRAVENOUS

## 2012-11-04 MED ORDER — PROPOFOL 10 MG/ML IV BOLUS
INTRAVENOUS | Status: DC | PRN
  Administered 2012-11-04: 10 mg via INTRAVENOUS

## 2012-11-04 MED ORDER — PROPOFOL 10 MG/ML IV EMUL
INTRAVENOUS | Status: AC
  Filled 2012-11-04: qty 20

## 2012-11-04 MED ORDER — OMEPRAZOLE 20 MG PO CPDR: DELAYED_RELEASE_CAPSULE | ORAL | Status: DC

## 2012-11-04 MED ORDER — PROPOFOL INFUSION 10 MG/ML OPTIME
INTRAVENOUS | Status: DC | PRN
  Administered 2012-11-04 (×2): via INTRAVENOUS
  Administered 2012-11-04: 100 ug/kg/min via INTRAVENOUS
  Administered 2012-11-04 (×2): via INTRAVENOUS

## 2012-11-04 MED ORDER — MIDAZOLAM HCL 2 MG/2ML IJ SOLN
INTRAMUSCULAR | Status: AC
  Filled 2012-11-04: qty 2

## 2012-11-04 MED ORDER — FENTANYL CITRATE 0.05 MG/ML IJ SOLN: 25.0000 ug | INTRAMUSCULAR | Status: DC | PRN

## 2012-11-04 MED ORDER — INSULIN REGULAR HUMAN 100 UNIT/ML IJ SOLN
5.0000 [IU] | Freq: Once | INTRAMUSCULAR | Status: AC
  Administered 2012-11-04: 5 [IU] via INTRAVENOUS
  Filled 2012-11-04: qty 0.05

## 2012-11-04 MED ORDER — LACTATED RINGERS IV SOLN
INTRAVENOUS | Status: DC
  Administered 2012-11-04: 08:00:00 via INTRAVENOUS

## 2012-11-04 MED ORDER — STERILE WATER FOR IRRIGATION IR SOLN
Status: DC | PRN
  Administered 2012-11-04: 09:00:00

## 2012-11-04 MED ORDER — GLYCOPYRROLATE 0.2 MG/ML IJ SOLN
INTRAMUSCULAR | Status: AC
  Filled 2012-11-04: qty 1

## 2012-11-04 MED ORDER — GLYCOPYRROLATE 0.2 MG/ML IJ SOLN
0.2000 mg | Freq: Once | INTRAMUSCULAR | Status: AC
  Administered 2012-11-04: 0.2 mg via INTRAVENOUS

## 2012-11-04 MED ORDER — MIDAZOLAM HCL 2 MG/2ML IJ SOLN
1.0000 mg | INTRAMUSCULAR | Status: DC | PRN
  Administered 2012-11-04 (×2): 2 mg via INTRAVENOUS

## 2012-11-04 MED ORDER — ONDANSETRON HCL 4 MG/2ML IJ SOLN: 4.0000 mg | Freq: Once | INTRAMUSCULAR | Status: DC | PRN

## 2012-11-04 MED ORDER — ONDANSETRON HCL 4 MG/2ML IJ SOLN
INTRAMUSCULAR | Status: AC
  Filled 2012-11-04: qty 2

## 2012-11-04 MED ORDER — BUTAMBEN-TETRACAINE-BENZOCAINE 2-2-14 % EX AERO
1.0000 | INHALATION_SPRAY | Freq: Once | CUTANEOUS | Status: AC
  Administered 2012-11-04: 1 via TOPICAL
  Filled 2012-11-04: qty 56

## 2012-11-04 MED ORDER — FENTANYL CITRATE 0.05 MG/ML IJ SOLN
INTRAMUSCULAR | Status: DC | PRN
  Administered 2012-11-04: 50 ug via INTRAVENOUS

## 2012-11-04 MED ORDER — WATER FOR IRRIGATION, STERILE IR SOLN
Status: DC | PRN
  Administered 2012-11-04: 1000 mL

## 2012-11-04 SURGICAL SUPPLY — 23 items
BLOCK BITE 60FR ADLT L/F BLUE (MISCELLANEOUS) ×2 IMPLANT
ELECT REM PT RETURN 9FT ADLT (ELECTROSURGICAL)
ELECTRODE REM PT RTRN 9FT ADLT (ELECTROSURGICAL) IMPLANT
FCP BXJMBJMB 240X2.8X (CUTTING FORCEPS)
FLOOR PAD 36X40 (MISCELLANEOUS) ×2
FORCEPS BIOP RAD 4 LRG CAP 4 (CUTTING FORCEPS) ×2 IMPLANT
FORCEPS BIOP RJ4 240 W/NDL (CUTTING FORCEPS)
FORCEPS BXJMBJMB 240X2.8X (CUTTING FORCEPS) IMPLANT
INJECTOR/SNARE I SNARE (MISCELLANEOUS) IMPLANT
LUBRICANT JELLY 4.5OZ STERILE (MISCELLANEOUS) ×2 IMPLANT
MANIFOLD NEPTUNE II (INSTRUMENTS) ×2 IMPLANT
NEEDLE SCLEROTHERAPY 25GX240 (NEEDLE) IMPLANT
PAD FLOOR 36X40 (MISCELLANEOUS) ×1 IMPLANT
PROBE APC STR FIRE (PROBE) IMPLANT
PROBE INJECTION GOLD (MISCELLANEOUS)
PROBE INJECTION GOLD 7FR (MISCELLANEOUS) IMPLANT
SNARE ROTATE MED OVAL 20MM (MISCELLANEOUS) ×2 IMPLANT
SNARE SHORT THROW 13M SML OVAL (MISCELLANEOUS) ×2 IMPLANT
SYR 50ML LL SCALE MARK (SYRINGE) ×2 IMPLANT
TRAP SPECIMEN MUCOUS 40CC (MISCELLANEOUS) ×2 IMPLANT
TUBING ENDO SMARTCAP PENTAX (MISCELLANEOUS) ×2 IMPLANT
TUBING IRRIGATION ENDOGATOR (MISCELLANEOUS) ×2 IMPLANT
WATER STERILE IRR 1000ML POUR (IV SOLUTION) ×4 IMPLANT

## 2012-11-04 NOTE — Transfer of Care (Signed)
Immediate Anesthesia Transfer of Care Note  Patient: Samuel [name]  Procedure(s) Performed: Procedure(s) (LRB) with comments: COLONOSCOPY WITH PROPOFOL (N/A) - in cecum at 0859, end at 1009 total time = 1 hour 10 minutes ESOPHAGOGASTRODUODENOSCOPY (EGD) WITH PROPOFOL (N/A) - start at 10:17 BIOPSY (N/A) POLYPECTOMY (N/A)  Patient Location: PACU  Anesthesia Type:MAC  Level of Consciousness: awake  Airway & Oxygen Therapy: Patient Spontanous Breathing  Post-op Assessment: Report given to PACU RN  Post vital signs: Reviewed  Complications: No apparent anesthesia complications

## 2012-11-04 NOTE — Op Note (Signed)
[facility] [address] [place], [address]   COLONOSCOPY PROCEDURE REPORT  PATIENT: Samuel Samuel [name], Samuel [name]  MR#: [id number] BIRTHDATE: 12-Nov-1955 , 56  yrs. old GENDER: Male ENDOSCOPIST: Jonette Eva, MD REFERRED BY: PROCEDURE DATE:  11/04/2012 PROCEDURE:   Colonoscopy with cold biopsy polypectomy and Colonoscopy with snare polypectomy INDICATIONS:Average risk patient for colon cancer. MEDICATIONS: MAC sedation, administered by CRNA DESCRIPTION OF PROCEDURE:    Physical exam was performed.  Informed consent was obtained from the patient after explaining the benefits, risks, and alternatives to procedure.  The patient was connected to monitor and placed in left lateral position. Continuous oxygen was provided by nasal cannula and IV medicine administered through an indwelling cannula.  After administration of sedation and rectal exam, the patients rectum was intubated and the     colonoscope was advanced under direct visualization to the ileum.  The scope was removed slowly by carefully examining the color, texture, anatomy, and integrity mucosa on the way out.  The patient was recovered in endoscopy and discharged home in satisfactory condition.    COLON FINDINGS: Multiple smooth sessile polyps measuring 3-9 mm in size were found at the cecum, in the ascending colon, transverse colon, descending colon, and sigmoid colon.  A polypectomy was performed with cold forceps and using snare cautery.  , Mild diverticulosis was noted in the transverse colon, descending colon, and sigmoid colon.  , and Small internal hemorrhoids were found.  PREP QUALITY: good. CECAL W/D TIME: 69 minutes COMPLICATIONS: None  ENDOSCOPIC IMPRESSION: 1.   34 COLON polyps REMOVED 2.   Mild diverticulosis was noted in the transverse colon, descending colon, and sigmoid colon 3.   Small internal hemorrhoids  RECOMMENDATIONS: STRICTLY AVOID ASPIRIN, BC/GOODY POWDERS, IBUPROFEN/MOTRIN,  OR NAPROXEN/ALEVE FOR 7 DAYS.  RESTART FEB 11. AVOID ITEMS THAT TRIGGER GASTRITIS. FOLLOW A HIGH FIBER/LOW FAT DIET.  AVOID ITEMS THAT CAUSE BLOATING.  BIOPSY RESULTS WILL BE BACK IN 7 DAYS. FOLLOW UP IN 3 MOS. Next colonoscopy in 1-3 years WITH 1.5 HR TIME SLOT.  ALL FIRST DEGREE RELATIVES NEED A COLONOSCOPY AT AGE 10 & then every 5 years.       _______________________________ Rosalie DoctorJonette Eva, MD 11/04/2012 10:49 AM     PATIENT NAME:  Samuel Samuel [name], Samuel [name] MR#: [id number]

## 2012-11-04 NOTE — Anesthesia Postprocedure Evaluation (Signed)
  Anesthesia Post-op Note  Patient: JAYMIN WALN  Procedure(s) Performed: Procedure(s) (LRB) with comments: COLONOSCOPY WITH PROPOFOL (N/A) - in cecum at 0859, end at 1009 total time = 1 hour 10 minutes ESOPHAGOGASTRODUODENOSCOPY (EGD) WITH PROPOFOL (N/A) - start at 10:17 BIOPSY (N/A) POLYPECTOMY (N/A)  Patient Location: PACU  Anesthesia Type:MAC  Level of Consciousness: awake, alert  and oriented  Airway and Oxygen Therapy: Patient Spontanous Breathing and Patient connected to face mask oxygen  Post-op Pain: none  Post-op Assessment: Post-op Vital signs reviewed, Patient's Cardiovascular Status Stable, Respiratory Function Stable, Patent Airway and No signs of Nausea or vomiting  Post-op Vital Signs: Reviewed and stable  Complications: No apparent anesthesia complications

## 2012-11-04 NOTE — H&P (Signed)
Primary Care Physician:  No primary provider on file. Primary Gastroenterologist:  Dr. Darrick Penna  Pre-Procedure History & Physical: HPI:  Samuel [name] is a 57 y.o. male here for  SCREENING FOR COLON CA AND VARICES.  Past Medical History  Diagnosis Date  . Diabetes mellitus without complication   . Hypertension   . Neuropathy   . Arthritis   . Lower back pain   . Hepatitis C   . Mental disorder   . Depression   . Anxiety     Past Surgical History  Procedure Date  . Appendectomy     as child    Prior to Admission medications   Medication Sig Start Date End Date Taking? Authorizing Provider  ciprofloxacin (CIPRO) 500 MG tablet Take 500 mg by mouth 2 (two) times daily. 09/01/12  Yes Historical Provider, MD  Linaclotide Karlene Einstein) 145 MCG CAPS Take 1 capsule (145 mcg total) by mouth daily. 10/23/12  Yes Nira Retort, NP  Menthol-Methyl Salicylate (ICY HOT EXTRA STRENGTH) 10-30 % CREA Apply 1 application topically daily as needed. For pain   Yes Historical Provider, MD  metroNIDAZOLE (FLAGYL) 500 MG tablet Take 500 mg by mouth 2 (two) times daily. 09/01/12  Yes Historical Provider, MD  polyethylene glycol-electrolytes (TRILYTE) 420 G solution Take 4,000 mLs by mouth as directed. 10/27/12  Yes West Bali, MD  amLODipine (NORVASC) 5 MG tablet Take 5 mg by mouth daily.    Historical Provider, MD  aspirin EC 81 MG tablet Take 81 mg by mouth daily.    Historical Provider, MD  glipiZIDE (GLUCOTROL) 10 MG tablet Take 10 mg by mouth 2 (two) times daily.    Historical Provider, MD  HYDROcodone-acetaminophen (VICODIN) 5-500 MG per tablet Take 1 tablet by mouth every 4 (four) hours as needed for pain. 10/22/12   Nira Retort, NP  lisinopril (PRINIVIL,ZESTRIL) 20 MG tablet Take 20 mg by mouth daily.    Historical Provider, MD  metFORMIN (GLUCOPHAGE) 500 MG tablet Take 500 mg by mouth 2 (two) times daily.    Historical Provider, MD  mineral oil liquid Take 30-45 mLs by mouth daily as needed. For  bowel movements    Historical Provider, MD  omeprazole (PRILOSEC) 20 MG capsule Take 1 capsule (20 mg total) by mouth daily. 10/08/12   Donnetta Hutching, MD    Allergies as of 10/27/2012  . (No Known Allergies)    Family History  Problem Relation Age of Onset  . Colon cancer Neg Hx   . Colon polyps Neg Hx   . Lung cancer Son     History   Social History  . Marital Status: Divorced    Spouse Name: N/A    Number of Children: N/A  . Years of Education: N/A   Occupational History  . Not on file.   Social History Main Topics  . Smoking status: Current Every Day Smoker -- 0.5 packs/day for 40 years    Types: Cigarettes  . Smokeless tobacco: Not on file  . Alcohol Use: No     Comment: prior alcoholic  . Drug Use: No     Comment: used marijuana for pain one time last week  . Sexually Active: Not on file   Other Topics Concern  . Not on file   Social History Narrative  . No narrative on file    Review of Systems: See HPI, otherwise negative ROS   Physical Exam: BP 165/95  Temp 98.2 F (36.8 C) (Oral)  Resp  22  SpO2 94% General:   Alert,  pleasant and cooperative in NAD Head:  Normocephalic and atraumatic. Neck:  Supple; Lungs:  Clear throughout to auscultation.    Heart:  Regular rate and rhythm. Abdomen:  Soft, nontender and nondistended. Normal bowel sounds, without guarding, and without rebound.   Neurologic:  Alert and  oriented x4;  grossly normal neurologically.  Impression/Plan:     SCREENING FOR COLON CA AND VARICES  PLAN:  1.EGD/TCS TODAY

## 2012-11-04 NOTE — Op Note (Signed)
[facility] [address] [place], [address]   ENDOSCOPY PROCEDURE REPORT  PATIENT: Samuel [name], Samuel [name]  MR#: [id number] BIRTHDATE: 1956-07-28 , 56  yrs. old GENDER: Male ENDOSCOPIST: Jonette Eva, MD REFERRED BY: PROCEDURE DATE: 11/04/2012 PROCEDURE:   EGD w/ biopsy INDICATIONS:Barrett's screening.   Screening for varices. PT DENIES DYSPHAGIA. 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 I PERSONALLY REVIEW JAN 2014 CT WITH DR. Tyron Russell TODAY. MEDICATIONS: MAC sedation, administered by CRNA TOPICAL ANESTHETIC:   Cetacaine Spray DESCRIPTION OF PROCEDURE:     Physical exam was performed.  Informed consent was obtained from the patient after explaining the benefits, risks, and alternatives to the procedure.  The patient was connected to the monitor and placed in the left lateral position.  Continuous oxygen was provided by nasal cannula and IV medicine administered through an indwelling cannula.  After administration of sedation, the patients esophagus was intubated and the     endoscope was advanced under direct visualization to the second portion of the duodenum.  The scope was removed slowly by carefully examining the color, texture, anatomy, and integrity of the mucosa on the way out.  The patient was recovered in endoscopy and discharged home in satisfactory condition.   ESOPHAGUS: Linear erosions at the GE junction.  UNABLE TO APPRECIATE BARRETT'S ESOPHAGUS.   A stricture was found at the gastroesophageal junction.  The stenosis was traversable with the endoscope.   A medium sized hiatal hernia was noted.   STOMACH: Moderate erosive gastritis (inflammation) was found in the entire examined stomach.  Multiple biopsies were performed using cold forceps.   DUODENUM: Moderate duodenal inflammation was found in the bulb and second portion of the duodenum. COMPLICATIONS:   None  ENDOSCOPIC IMPRESSION: 1.   Linear erosions at the GE junction.  UNABLE TO  APPRECIATE BARRETT'S ESOPHAGUS. 2.   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   MODERATE gastritis. 4.   MODERATE DUODENITIS likely accounts for findings on CT.  RECOMMENDATIONS: STRICTLY AVOID ASPIRIN, BC/GOODY POWDERS, IBUPROFEN/MOTRIN, OR NAPROXEN/ALEVE FOR 7 DAYS.  RESTART FEB 11. FOLLOW RECOMMENDATIONS FOR CONTROLLING REFLUX. CONTINUE OMEPRAZOLE.  TAKE 30 MINUTES PRIOR TO BREAKFAST AND SUPPER.  AVOID ITEMS THAT TRIGGER GASTRITIS. FOLLOW A HIGH FIBER/LOW FAT DIET.  AVOID ITEMS THAT CAUSE BLOATING.  BIOPSY RESULTS WILL BE BACK IN 7 DAYS. FOLLOW UP IN 3 MOS. REPEAT CT TO CONFIRM DUODENAL WALL IS NL, IF NOT PT WILL NEED ENTEROSCOPY. NEXT UPPER ENDOSCOPY IN 3 YEARS TO SCREEN FOR BARRETT'S AND VARICES.   REPEAT EXAM:   _______________________________ Rosalie DoctorJonette Eva, MD 11/04/2012 10:58 AM       PATIENT NAME:  Samuel [name] MR#: [id number]

## 2012-11-04 NOTE — Telephone Encounter (Signed)
REVIEWED.  

## 2012-11-04 NOTE — Anesthesia Preprocedure Evaluation (Signed)
Anesthesia Evaluation  Patient identified by MRN, date of birth, ID band Patient awake    Reviewed: Allergy & Precautions, H&P , NPO status , Patient's Chart, lab work & pertinent test results  History of Anesthesia Complications Negative for: history of anesthetic complications  Airway Mallampati: I TM Distance: >3 FB     Dental  (+) Edentulous Upper and Edentulous Lower   Pulmonary Current Smoker, PE: am cough. breath sounds clear to auscultation        Cardiovascular hypertension, Pt. on medications Rhythm:Regular Rate:Normal     Neuro/Psych PSYCHIATRIC DISORDERS Anxiety Depression    GI/Hepatic (+)     substance abuse  alcohol use and marijuana use, Hepatitis -, C  Endo/Other  diabetes, Poorly Controlled, Type 2, Oral Hypoglycemic Agents  Renal/GU      Musculoskeletal   Abdominal   Peds  Hematology   Anesthesia Other Findings   Reproductive/Obstetrics                           Anesthesia Physical Anesthesia Plan  ASA: III  Anesthesia Plan: MAC   Post-op Pain Management:    Induction: Intravenous  Airway Management Planned: Simple Face Mask  Additional Equipment:   Intra-op Plan:   Post-operative Plan:   Informed Consent: I have reviewed the patients History and Physical, chart, labs and discussed the procedure including the risks, benefits and alternatives for the proposed anesthesia with the patient or authorized representative who has indicated his/her understanding and acceptance.     Plan Discussed with:   Anesthesia Plan Comments:         Anesthesia Quick Evaluation

## 2012-11-04 NOTE — OR Nursing (Signed)
Samuel [name] called leigh ann at dr [facility] office to get a consult for Samuel [name] for assessment of hepatitis and prostate

## 2012-11-04 NOTE — Telephone Encounter (Signed)
Referrals have been sent to [facility] and the Hep C clinic in [place] and they will contact the patient for dates & times

## 2012-11-04 NOTE — Telephone Encounter (Addendum)
PT NEEDS REFERRAL TO UROLOGY FOR LOWER ABD PAIN/SCTOTAL PAIN. REFER PT TO HCV CLINIC IN [place]. PT IS UNINSURED.

## 2012-11-05 ENCOUNTER — Telehealth: Payer: Self-pay

## 2012-11-05 ENCOUNTER — Encounter (HOSPITAL_COMMUNITY): Payer: Self-pay | Admitting: Gastroenterology

## 2012-11-05 NOTE — Telephone Encounter (Signed)
PLEASE CALL PT'S FAMILY.  HE HAD 34 POLYPS REMOVED. HE MAY SEE SOME BLOOD IN HIS STOOL. IF HE CONTINUES TO SEE BLOOD IN HIS STOOL HE SHOULD GO TO THE ED. HE SHOULD FOLLOW A LOW RESIDUE DIET FOR THE NEXT 3 DAYS.  Low Fiber and Residue Restricted Diet A low fiber diet restricts foods that contain carbohydrates that are not digested in the small intestine. A diet containing about 10 g of fiber is considered low fiber. The diet needs to be individualized to suit patient tolerances and preferences and to avoid unnecessary restrictions. Generally, the foods emphasized in a low fiber diet have no skins or seeds. They may have been processed to remove bran, germ, or husks. Cooking may not necessarily eliminate the fiber. Cooking may, in fact, enable a greater quantity of fiber to be consumed in a lesser volume. Legumes and nuts are also restricted. The term low residue has also been used to describe low fiber diets, although the two are not the same. Residue refers to any substance that adds to bowel (colonic) contents, such as sloughed cells and intestinal bacteria, in addition to fiber. Residue-containing foods, prunes and prune juice, milk, and connective tissue from meats may also need to be eliminated. It is important to eliminate these foods during sudden (acute) attacks of inflammatory bowel disease, when there is a partial obstruction due to another reason, or when minimal fecal output is desired. When these problems are gone, a more normal diet may be used.  PURPOSE  Reduce stool weight and volume.   CHOOSING FOODS Check labels, especially on foods from the starch list. Often, dietary fiber content is listed with the Nutrition Facts panel.  Breads and Starches  Allowed: White, [place], and pita breads, plain rolls, buns, or sweet rolls, doughnuts, waffles, pancakes, bagels. Plain muffins, sweet breads, biscuits, matzoth. Flour. Soda, saltine, or graham crackers. Pretzels, rusks, melba toast,  zwieback. Cooked cereals: cornmeal, farina, cream cereals. Dry cereals: refined corn, wheat, rice, and oat cereals (check label). Potatoes prepared any way without skins, refined macaroni, spaghetti, noodles, refined rice.   Avoid: Bread, rolls, or crackers made with whole-wheat, multigrains, rye, bran seeds, nuts, or coconut. Corn tortillas, table-shells. Corn chips, tortilla chips. Cereals containing whole-grains, multigrains, bran, coconut, nuts, or raisins. Cooked or dry oatmeal. Coarse wheat cereals, granola. Cereals advertised as "high fiber." Potato skins. Whole-grain pasta, wild or brown rice. Popcorn.  Vegetables  Allowed:  Strained tomato and vegetable juices. Fresh: tender lettuce, cucumber, cabbage, spinach, bean sprouts. Cooked, canned: asparagus, bean sprouts, cut green or wax beans, cauliflower, pumpkin, beets, mushrooms, olives, spinach, yellow squash, tomato, tomato sauce (no seeds), zucchini (peeled), turnips. Canned sweet potatoes. Small amounts of celery, onion, radish, and green pepper may be used. Keep servings limited to  cup.   Avoid: Fresh, cooked, or canned: artichokes, baked beans, beet greens, broccoli, Brussels sprouts, French-style green beans, corn, kale, legumes, peas, sweet potatoes. Cooked: green or red cabbage, spinach. Avoid large servings of any vegetables.  Fruit  Allowed:  All fruit juices except prune juice. Cooked or canned: apricots applesauce, cantaloupe, cherries, grapefruit, grapes, kiwi, mandarin oranges, peaches, pears, fruit cocktail, pineapple, plums, watermelon. Fresh: banana, grapes, cantaloupe, avocado, cherries, pineapple, grapefruit, kiwi, nectarines, peaches, oranges, blueberries, plums. Keep servings limited to  cup or 1 piece.   Avoid: Fresh: apple with or without skin, apricots, mango, pears, raspberries, strawberries. Prune juice, stewed or dried prunes. Dried fruits, raisins, dates. Avoid large servings of all fresh fruits.  Meat and  Meat  Substitutes  Allowed:  Ground or well-cooked tender beef, ham, veal, lamb, pork, or poultry. Eggs, plain cheese. Fish, oysters, shrimp, lobster, other seafood. Liver, organ meats.   Avoid: Tough, fibrous meats with gristle. Peanut butter, smooth or chunky. Cheese with seeds, nuts, or other foods not allowed. Nuts, seeds, legumes, dried peas, beans, lentils.  Milk  Allowed:  All milk products except those not allowed. Milk and milk product consumption should be minimal when low residue is desired.   Avoid: Yogurt that contains nuts or seeds.  Soups and Combination Foods  Allowed:  Bouillon, broth, or cream soups made from allowed foods. Any strained soup. Casseroles or mixed dishes made with allowed foods.   Avoid: Soups made from vegetables that are not allowed or that contain other foods not allowed.  Desserts and Sweets  Allowed:  Plain cakes and cookies, pie made with allowed fruit, pudding, custard, cream pie. Gelatin, fruit, ice, sherbet, frozen ice pops. Ice cream, ice milk without nuts. Plain hard candy, honey, jelly, molasses, syrup, sugar, chocolate syrup, gumdrops, marshmallows.   Avoid: Desserts, cookies, or candies that contain nuts, peanut butter, or dried fruits. Jams, preserves with seeds, marmalade.  Fats and Oils  Allowed:  Margarine, butter, cream, mayonnaise, salad oils, plain salad dressings made from allowed foods. Plain gravy, crisp bacon without rind.   Avoid: Seeds, nuts, olives. Avocados.  Beverages  Allowed:  All, except those listed to avoid.   Avoid: Fruit juices with high pulp, prune juice.  Condiments  Allowed:  Ketchup, mustard, horseradish, vinegar, cream sauce, cheese sauce, cocoa powder. Spices in moderation: allspice, basil, bay leaves, celery powder or leaves, cinnamon, cumin powder, curry powder, ginger, mace, marjoram, onion or garlic powder, oregano, paprika, parsley flakes, ground pepper, rosemary, sage, savory, tarragon, thyme, turmeric.    Avoid: Coconut, pickles.  SAMPLE MEAL PLAN The following menu is provided as a sample. Your daily menu plans will vary. Be sure to include a minimum of the following each day in order to provide essential nutrients for the adult:  Starch/Bread/Cereal Group, 6 servings.   Fruit/Vegetable Group, 5 servings.   Meat/Meat Substitute Group, 2 servings.   Milk/Milk Substitute Group, 2 servings.  A serving is equal to  cup for fruits, vegetables, and cooked cereals or 1 piece for foods such as a piece of bread, 1 orange, or 1 apple. For dry cereals and crackers, use serving sizes listed on the label. Combination foods may count as full or partial servings from various food groups. Fats, desserts, and sweets may be added to the meal plan after the requirements for essential nutrients are met. SAMPLE MENU Breakfast   cup orange juice.   1 boiled egg.   1 slice white toast.   Margarine.    cup cornflakes.   1 cup milk.   Beverage.  Lunch   cup chicken noodle soup.   2 to 3 oz sliced roast beef.   2 slices seedless rye bread.   Mayonnaise.    cup tomato juice.   1 small banana.   Beverage.  Dinner  3 oz baked chicken.    cup scalloped potatoes.    cup cooked beets.   White dinner roll.   Margarine.    cup canned peaches.

## 2012-11-05 NOTE — Telephone Encounter (Signed)
Called and informed Jasmine December. Went over the diet. Ginger is going to e-mail the diet to her.

## 2012-11-05 NOTE — Telephone Encounter (Signed)
E-mailed Pt the diet.

## 2012-11-05 NOTE — Telephone Encounter (Signed)
T/C from [place], friend of pt. She said pt had a BM this morning and it was all liquid and had a large amount of bright red blood. He is having a little abdominal pain, but mostly like before his procedure. ( Procedures done yesterday) Please advise!

## 2012-11-06 ENCOUNTER — Encounter: Payer: Self-pay | Admitting: Gastroenterology

## 2012-11-06 ENCOUNTER — Telehealth: Payer: Self-pay

## 2012-11-06 ENCOUNTER — Other Ambulatory Visit: Payer: Self-pay

## 2012-11-06 ENCOUNTER — Telehealth: Payer: Self-pay | Admitting: Gastroenterology

## 2012-11-06 DIAGNOSIS — B1920 Unspecified viral hepatitis C without hepatic coma: Secondary | ICD-10-CM

## 2012-11-06 DIAGNOSIS — K625 Hemorrhage of anus and rectum: Secondary | ICD-10-CM

## 2012-11-06 LAB — AFP TUMOR MARKER: AFP-Tumor Marker: 4.2 ng/mL (ref 0.0–8.0)

## 2012-11-06 NOTE — Telephone Encounter (Signed)
Per Dr. Darrick Penna, I called pt and told him that she said that if the bright red blood was yesterday and the dark blood today, that can be expected with the number of polyps he had removed. I told him she said to go to the ED if he has any more bright red blood today. Otherwise, CBC tomorrow morning. Order being faxed to [facility].

## 2012-11-06 NOTE — Telephone Encounter (Signed)
Called and no answer.

## 2012-11-06 NOTE — Telephone Encounter (Signed)
No PCP on file, recalls made, appt made

## 2012-11-06 NOTE — Telephone Encounter (Signed)
[facility] is scheduled at the [facility] on 11/06/12 and he is aware

## 2012-11-06 NOTE — Telephone Encounter (Signed)
Called and informed pt.  

## 2012-11-06 NOTE — Telephone Encounter (Signed)
T/C from pt's friend, Jasmine December, said he had a BM yesterday with bright red blood and just had another BM today with dark red blood, large amount. She said he took a Tramadol today that he had on hand. Please advise! ( Sent a pager message to Dr. Darrick Penna)

## 2012-11-06 NOTE — Telephone Encounter (Signed)
Please call pt. His stomach Bx shows gastritis. HE had MULTIPLE simple adenomas removed from HIS colon.    THE REFERRAL TO UROLOGY AND THE [facility] CLIN[facility] HAVE BEEN MADE.  STRICTLY AVOID ASPIRIN, BC/GOODY POWDERS, IBUPROFEN/MOTRIN, OR NAPROXEN/ALEVE FOR 7 DAYS. YOU MAY RESTART ASA ON FEB 11.  FOLLOW RECOMMENDATIONS FOR CONTROLLING REFLUX.   CONTINUE OMEPRAZOLE. TAKE 30 MINUTES PRIOR TO BREAKFAST AND SUPPER.   AVOID ITEMS THAT TRIGGER GASTRITIS.   FOLLOW A HIGH FIBER/LOW FAT DIET. AVOID ITEMS THAT CAUSE BLOATING.   FOLLOW UP IN 3 MOS E30 ABDOMINAL PAIN.  Next colonoscopy in 3 years. ALL FIRST DEGREE RELATIVES NEED A COLONOSCOPY AT AGE 79 & then every 5 years. YOU SHOULD CONSIDER GENETIC TESTING FOR HEREDITARY SYNDROMES THAT MAY INCREASE YOUR RISK FOR GETTING COLON CANCER. THE TEST ARE EXPENSIVE AND IT'S OK TO WAIT UNTIL YOU GET INSURANCE.  NEXT UPPER ENDOSCOPY IN 3 YEARS.

## 2012-11-07 ENCOUNTER — Ambulatory Visit (INDEPENDENT_AMBULATORY_CARE_PROVIDER_SITE_OTHER): Payer: Self-pay | Admitting: Urology

## 2012-11-07 DIAGNOSIS — N509 Disorder of male genital organs, unspecified: Secondary | ICD-10-CM

## 2012-11-07 DIAGNOSIS — R3129 Other microscopic hematuria: Secondary | ICD-10-CM

## 2012-11-07 DIAGNOSIS — M545 Low back pain: Secondary | ICD-10-CM

## 2012-11-07 DIAGNOSIS — R109 Unspecified abdominal pain: Secondary | ICD-10-CM

## 2012-11-07 LAB — CBC WITH DIFFERENTIAL/PLATELET
Hemoglobin: 15.5 g/dL (ref 13.0–17.0)
Lymphs Abs: 4.9 10*3/uL — ABNORMAL HIGH (ref 0.7–4.0)
Monocytes Relative: 6 % (ref 3–12)
Neutro Abs: 7 10*3/uL (ref 1.7–7.7)
Neutrophils Relative %: 52 % (ref 43–77)
RBC: 4.74 MIL/uL (ref 4.22–5.81)

## 2012-11-07 LAB — HEPATITIS C GENOTYPE

## 2012-11-07 NOTE — Telephone Encounter (Signed)
REVIEWED. AGREE. 

## 2012-11-11 NOTE — Telephone Encounter (Signed)
Called and informed pt.  

## 2012-11-11 NOTE — Telephone Encounter (Signed)
No PCP on file 

## 2012-11-11 NOTE — Telephone Encounter (Addendum)
PLEASE CALL PT.  His blood count did drop a little after his colonoscopy but it is still normal. He still has Hepatis C AND HE IS TYPE 1 A. HE HAS BEEN REFERRED TO [facility] CLINIC. THE BEST THING HE CAN DO FOR HIS LIVER IN THE MEANTIME IS MAKE SURE HIS DIABETES IS UNDER GOOD CONTROL & TO MAINTAIN A NORMAL WEIGHT. HIS CURRENT WEIGHT SHOWS HE HAS A BMI OF 24. NORMAL IS 20-25.

## 2012-11-11 NOTE — Telephone Encounter (Signed)
Called and informed the pt.  

## 2012-11-25 ENCOUNTER — Telehealth: Payer: Self-pay

## 2012-11-25 NOTE — Telephone Encounter (Signed)
Vm from [place] and I returned her call. Pt is still having the LLQ pain and nothing has changed. He goes 4-5 days without a Bm. He is taking Linzess once day in the AM. He take s two Ducolx in the PM. He is concerned because the pain does not go away. He feels there is something wrong in his colon. He saw a urologist and was told no problems there. However he was referred to a physical therapist and he thought it was because he had hernias. But was also told that he has pinch  Nerve and problems with 2 discs in his back. He wants to know if Gerrit Halls, NP can make any other recommendations. Please advise!

## 2012-11-25 NOTE — Telephone Encounter (Signed)
What dosage of Linzess is he taking? He may try Linzess 290 mcg if he is not currently taking. Should have an upcoming appt soon. If not, we can offer one.

## 2012-11-26 NOTE — Telephone Encounter (Signed)
Talked to [place]. Pt is taking the 145 mcg Linzess. I will leave samples of 290 mcg at the front for pick up. One daily before breakfast. Scheduled ov appt with AS on 12/17/2012 at 1:30 PM.

## 2012-11-27 ENCOUNTER — Telehealth: Payer: Self-pay | Admitting: Gastroenterology

## 2012-11-27 NOTE — Telephone Encounter (Signed)
I dont know any other Hep C Clinics around here

## 2012-11-27 NOTE — Telephone Encounter (Signed)
L[place] called from the [facility] and stated that Samuel [name] refused to be seen there because he does not have any medical insurance and they do not accept [facility] and he cant afford it.

## 2012-11-27 NOTE — Telephone Encounter (Signed)
REVIEWED.  

## 2012-11-27 NOTE — Telephone Encounter (Signed)
Noted.  Who does take [facility] Assistance? Anybody in the area?

## 2012-12-09 ENCOUNTER — Ambulatory Visit: Payer: Self-pay | Attending: Urology | Admitting: Physical Therapy

## 2012-12-09 DIAGNOSIS — IMO0001 Reserved for inherently not codable concepts without codable children: Secondary | ICD-10-CM | POA: Insufficient documentation

## 2012-12-09 DIAGNOSIS — M545 Low back pain, unspecified: Secondary | ICD-10-CM | POA: Insufficient documentation

## 2012-12-11 NOTE — Progress Notes (Signed)
TCS FEB 2014 SIMPLE [id number])  EGD FEB 2014 GASTRITIS  REVIEWED.

## 2012-12-16 ENCOUNTER — Encounter: Payer: Self-pay | Admitting: Gastroenterology

## 2012-12-17 ENCOUNTER — Ambulatory Visit: Payer: Self-pay

## 2012-12-17 ENCOUNTER — Encounter: Payer: Self-pay | Admitting: Gastroenterology

## 2012-12-17 ENCOUNTER — Ambulatory Visit (INDEPENDENT_AMBULATORY_CARE_PROVIDER_SITE_OTHER): Payer: Self-pay | Admitting: Gastroenterology

## 2012-12-17 VITALS — BP 121/83 | HR 117 | Temp 97.7°F | Ht 76.0 in | Wt 209.8 lb

## 2012-12-17 DIAGNOSIS — B1920 Unspecified viral hepatitis C without hepatic coma: Secondary | ICD-10-CM

## 2012-12-17 DIAGNOSIS — R109 Unspecified abdominal pain: Secondary | ICD-10-CM

## 2012-12-17 DIAGNOSIS — K7460 Unspecified cirrhosis of liver: Secondary | ICD-10-CM

## 2012-12-17 MED ORDER — LINACLOTIDE 290 MCG PO CAPS: 1.0000 | ORAL_CAPSULE | Freq: Every day | ORAL | Status: DC

## 2012-12-17 NOTE — Progress Notes (Signed)
Primary Care Physician:  No primary provider on file. Primary Gastroenterologist: Dr. Darrick Penna    Chief Complaint  Patient presents with  . Follow-up    HPI:   57 year old male returns today in follow-up after EGD/TCS. 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 Chronic abdominal pain, constipation, GERD.  States pain is constant located left groin area.. Feels like a tear of some sort. Lifting too heavy worsens, turns bothers. Will be lying completely still and feels like a jabbing. Pain stays at about a 6 all the time. Interrupts sleep. Will worsen in 15 second intervals. Taking Linzess 290 mcg daily, has a BM every 3 days.   Has been unable to set up treatment for Hep C due to financial issues. Has [facility].   Past Medical History  Diagnosis Date  . Diabetes mellitus without complication   . Hypertension   . Neuropathy   . Arthritis   . Lower back pain   . Hepatitis C   . Mental disorder   . Depression   . Anxiety   . 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 Testicular pain, left 10/23/2012  . Abdominal pain 10/23/2012    Past Surgical History  Procedure Laterality Date  . Appendectomy      as child  . Colonoscopy with propofol  11/04/2012    WUJ:WJXBJ internal hemorrhoids/ Mild diverticulosis was noted in the transverse colon/ 34 COLON polyps REMOVED  . Esophagogastroduodenoscopy (egd) with propofol  11/04/2012    YNW:GNFAOZ erosions at the GE junction/PATENT Stricture was found at the gastroesophageal junction/MODERATE gastritis/MODERATE DUODENITIS likely accounts for findings on CT.  Marland Kitchen Esophageal biopsy  11/04/2012    Procedure: BIOPSY;  Surgeon: West Bali, MD;  Location: [facility];  Service: Endoscopy;  Laterality: N/A;  gastric  . Polypectomy  11/04/2012    Procedure: POLYPECTOMY;  Surgeon: West Bali, MD;  Location: [facility];  Service: Endoscopy;  Laterality: N/A;    Current Outpatient Prescriptions  Medication Sig Dispense Refill  . amLODipine (NORVASC) 5 MG tablet  Take 5 mg by mouth daily.      Marland Kitchen aspirin 81 MG tablet Take 81 mg by mouth daily.      Marland Kitchen glipiZIDE (GLUCOTROL) 10 MG tablet Take 10 mg by mouth 2 (two) times daily.      . Linaclotide (LINZESS) 145 MCG CAPS Take 1 capsule (145 mcg total) by mouth daily.  30 capsule  3  . lisinopril (PRINIVIL,ZESTRIL) 20 MG tablet Take 20 mg by mouth daily.      . Menthol-Methyl Salicylate (ICY HOT EXTRA STRENGTH) 10-30 % CREA Apply 1 application topically daily as needed. For pain      . metFORMIN (GLUCOPHAGE) 500 MG tablet Take 500 mg by mouth 2 (two) times daily.      Marland Kitchen omeprazole (PRILOSEC) 20 MG capsule 20 mg daily. TAKE 1 po bid 30 MINUTES PRIOR TO your MEALs.       No current facility-administered medications for this visit.    Allergies as of 12/17/2012  . (No Known Allergies)    Family History  Problem Relation Age of Onset  . Colon cancer Neg Hx   . Colon polyps Neg Hx   . Lung cancer Son     History   Social History  . Marital Status: Divorced    Spouse Name: N/A    Number of Children: N/A  . Years of Education: N/A   Social History Main Topics  . Smoking status: Current Every Day Smoker --  0.50 packs/day for 40 years    Types: Cigarettes  . Smokeless tobacco: None  . Alcohol Use: No     Comment: prior alcoholic  . Drug Use: No     Comment: used marijuana for pain one time last week  . Sexually Active: None   Other Topics Concern  . None   Social History Narrative  . None    Review of Systems: Negative unless mentioned above.   Physical Exam: BP 121/83  Pulse 117  Temp(Src) 97.7 F (36.5 C) (Oral)  Ht 6\' 4"  (1.93 m)  Wt 209 lb 12.8 oz (95.165 kg)  BMI 25.55 kg/m2 General:   Alert and oriented. No distress noted. Pleasant and cooperative.  Head:  Normocephalic and atraumatic. Eyes:  Conjuctiva clear without scleral icterus. Mouth:  Oral mucosa pink and moist. Good dentition. No lesions. Heart:  S1, S2 present without murmurs, rubs, or gallops. Regular rate and  rhythm. Abdomen:  +BS, soft, TTP LLQ, left groin,  and non-distended. No rebound or guarding. No HSM or masses noted. Msk:  Symmetrical without gross deformities. Normal posture. Extremities:  Without edema. Neurologic:  Alert and  oriented x4;  grossly normal neurologically. Skin:  Intact without significant lesions or rashes.  Psych:  Alert and cooperative. Normal mood and affect.

## 2012-12-17 NOTE — Patient Instructions (Addendum)
Continue to take Linzess 290 mcg daily. I have sent the prescription to your pharmacy.  We have referred you for a surgical consultation with Dr. Leticia Penna or Dr. Lovell Sheehan.  We will try to find a place that will treat for Hepatitis C.   We will see you back in 6 weeks.

## 2012-12-18 ENCOUNTER — Telehealth: Payer: Self-pay | Admitting: Gastroenterology

## 2012-12-18 NOTE — Telephone Encounter (Signed)
Routing to DS. This is a [facility] pt. Gave DS pt assistance forms.

## 2012-12-18 NOTE — Telephone Encounter (Signed)
Called and informed Samuel [name] that the [employer] and the Patient assistance forms are at front for pick up.

## 2012-12-18 NOTE — Telephone Encounter (Signed)
Patient asked if he could hold off on going to the general surgeon right now, he dont have any insurance and his funds are low

## 2012-12-18 NOTE — Telephone Encounter (Signed)
Patients Girlfriend called and stated that he didn't pick up the Linzess from [facility] it was $200 and he cant afford it

## 2012-12-18 NOTE — Telephone Encounter (Signed)
Please have patient pick up a voucher for Linzess. Should be able to fill X 30 days with the current prescription on file. Needs to fill out patient assistance forms.

## 2012-12-19 ENCOUNTER — Ambulatory Visit: Payer: Self-pay | Admitting: Urology

## 2012-12-19 NOTE — Assessment & Plan Note (Signed)
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 Compensated. Pt unable to establish care for treatment due to [facility] Assistance. Will need to research further options for him. Needs Hep A and B vaccinations. Return in 6 weeks.

## 2012-12-19 NOTE — Assessment & Plan Note (Signed)
Due to abnormal CT findings, due for repeat CT in April/May to reassess duodenum. If continued abnormality, needs enteroscopy. MELD 7. Next EGD Feb 2017 to screen for varices. Needs Hep A and B vaccinations. 6 week f/u.

## 2012-12-19 NOTE — Assessment & Plan Note (Signed)
In the setting of constipation. However, constipation significantly improved with Linzess 290. Continue current dosing. May need patient assistance forms.  CT abd noted fat-containing inguinal hernias. With patient's left groin pain, constant, worsening with movement, will refer to General Surgery for consideration of elective repair.

## 2012-12-22 NOTE — Progress Notes (Signed)
No PCP on file 

## 2012-12-24 ENCOUNTER — Ambulatory Visit: Payer: Self-pay | Admitting: Physical Therapy

## 2012-12-24 NOTE — Telephone Encounter (Signed)
Noted.  This would be the next option, but I understand.

## 2012-12-31 ENCOUNTER — Telehealth: Payer: Self-pay | Admitting: Gastroenterology

## 2012-12-31 ENCOUNTER — Ambulatory Visit: Payer: Self-pay | Admitting: Physical Therapy

## 2012-12-31 ENCOUNTER — Telehealth (INDEPENDENT_AMBULATORY_CARE_PROVIDER_SITE_OTHER): Payer: Self-pay | Admitting: *Deleted

## 2012-12-31 NOTE — Telephone Encounter (Signed)
Please let patient know I have spoken with Dr. Darrick Penna and Dr. Karilyn Cota regarding treatment for Hep C. Dr. Karilyn Cota will be reviewing his records to determine if he is a candidate for treatment.  He will still follow-up with [place] for routine issues. Dr. Karilyn Cota accepts [facility]. Please remind patient to keep appt upcoming with me in May. Can cancel the appt with Verlon Au. Looks like he was scheduled for both, not clear why.

## 2012-12-31 NOTE — Telephone Encounter (Signed)
Called and informed pt. Cancelled the appt with Tana Coast, PA for 02/02/2013. The appt with Gerrit Halls, NP is for 01/29/2013 at 1:30 PM.

## 2012-12-31 NOTE — Telephone Encounter (Signed)
Thank you, Donna! 

## 2012-12-31 NOTE — Telephone Encounter (Signed)
Ann, what you sent will be fine. I will call the patient and get an apt scheduled with in the next week or two with Dorene Ar, NP. I am not sure how to do the referrals through [employer].  Sorry, Alden Server

## 2013-01-07 ENCOUNTER — Encounter: Payer: Self-pay | Admitting: Physical Therapy

## 2013-01-12 ENCOUNTER — Encounter (INDEPENDENT_AMBULATORY_CARE_PROVIDER_SITE_OTHER): Payer: Self-pay | Admitting: Internal Medicine

## 2013-01-12 ENCOUNTER — Ambulatory Visit (INDEPENDENT_AMBULATORY_CARE_PROVIDER_SITE_OTHER): Payer: Self-pay | Admitting: Internal Medicine

## 2013-01-12 VITALS — BP 122/50 | HR 88 | Ht 76.0 in | Wt 217.0 lb

## 2013-01-12 DIAGNOSIS — I10 Essential (primary) hypertension: Secondary | ICD-10-CM | POA: Insufficient documentation

## 2013-01-12 DIAGNOSIS — B1920 Unspecified viral hepatitis C without hepatic coma: Secondary | ICD-10-CM

## 2013-01-12 DIAGNOSIS — E119 Type 2 diabetes mellitus without complications: Secondary | ICD-10-CM | POA: Insufficient documentation

## 2013-01-12 NOTE — Patient Instructions (Addendum)
PT/INR, Hep B antigen.  Liver biopsy. OV in 6 months.

## 2013-01-12 NOTE — Progress Notes (Addendum)
Subjective:     Patient ID: Samuel [name], male   DOB: 1956-06-14, 57 y.o.   MRN: [id number]  HPI  Here today for evaluation for Hepatitis C. He has no tattoos. No hx of IV drug use. He does tell me he did do IV drug x 1. He does smoke marijuana weekly.  Appetite is okay. No weight loss in the past 30 days. No hx of  Major depression. Diagnosed at the [facility] in [place] 2 yrs ago.   He has not drank etoh in 7 yrs.  11/03/2012 Genotype 1A. Hep C Quant: high AFP 4.2 CBC    Component Value Date/Time   WBC 13.4* 11/06/2012 1642   RBC 4.74 11/06/2012 1642   HGB 15.5 11/06/2012 1642   HCT 43.2 11/06/2012 1642   PLT 241 11/06/2012 1642   MCV 91.1 11/06/2012 1642   MCH 32.7 11/06/2012 1642   MCHC 35.9 11/06/2012 1642   RDW 13.2 11/06/2012 1642   LYMPHSABS 4.9* 11/06/2012 1642   MONOABS 0.8 11/06/2012 1642   EOSABS 0.7 11/06/2012 1642   BASOSABS 0.1 11/06/2012 1642   CMP     Component Value Date/Time   NA 133* 11/03/2012 1300   K 4.1 11/03/2012 1300   CL 95* 11/03/2012 1300   CO2 26 11/03/2012 1300   GLUCOSE 372* 11/03/2012 1300   BUN 13 11/03/2012 1300   CREATININE 0.56 11/03/2012 1300   CALCIUM 10.2 11/03/2012 1300   PROT 8.1 10/08/2012 1458   ALBUMIN 4.2 10/08/2012 1458   AST 57* 10/08/2012 1458   ALT 111* 10/08/2012 1458   ALKPHOS 79 10/08/2012 1458   BILITOT 0.6 10/08/2012 1458   GFRNONAA >90 11/03/2012 1300   GFRAA >90 11/03/2012 1300  . CT abdomen/pelvis with CM 10/08/2012:1. Marked thickening of the duodenal wall which appears to be circumferential. No definite mass is identified. This finding is  of uncertain etiology and significance. This could represent a  duodenitis, however, there are no overt surrounding inflammatory  changes noted at this time. Alternatively, an infiltrative  neoplasm such as lymphoma would be difficult to entirely exclude.  Correlation with endoscopy may be warranted if clinically  indicated.  2. Very subtle nodular contour of the liver could suggest early  changes of cirrhosis.  3.  Colonic diverticulosis without findings to suggest an acute  diverticulitis at this time.  4. Extensive atherosclerosis.    11/04/2012 EGD/Colonoscopy: constipation, abdominal pain, cirrhosis.  ENDOSCOPIC IMPRESSION:  1. 34 COLON polyps REMOVED  2. Mild diverticulosis was noted in the transverse colon,  descending colon, and sigmoid colon  3. Small internal hemorrhoids Biopsy:colon polyp: Multiple fragments of tubular adenoma. No evidence of high grade dysplasia or malignancy. Colon polyp transverse: Tubular adenoma(multiple fragments). Sessile serrated adenoma (Two fragments) Colon Polyps Descending and sigmoid: Multiple fragments of tubular adenoma. No evidence of high grade dysplasia or malignancy.  Stomach Biopsy: Gastric body and antral type mucosa with associated minimal chronic inflammation. No evidence of H. Pylori, intestinal metaplasia, dysplasia, or malignancy.  Review of Systems Current Outpatient Prescriptions  Medication Sig Dispense Refill  . amLODipine (NORVASC) 5 MG tablet Take 5 mg by mouth daily.      Marland Kitchen aspirin 81 MG tablet Take 81 mg by mouth daily.      Marland Kitchen glipiZIDE (GLUCOTROL) 10 MG tablet Take 10 mg by mouth 2 (two) times daily.      . Linaclotide (LINZESS) 290 MCG CAPS Take 1 capsule by mouth daily.  30 capsule  11  .  lisinopril (PRINIVIL,ZESTRIL) 20 MG tablet Take 20 mg by mouth daily.      . Menthol-Methyl Salicylate (ICY HOT EXTRA STRENGTH) 10-30 % CREA Apply 1 application topically daily as needed. For pain      . metFORMIN (GLUCOPHAGE) 500 MG tablet Take 500 mg by mouth 2 (two) times daily.      Marland Kitchen omeprazole (PRILOSEC) 20 MG capsule 20 mg daily. TAKE 1 po bid 30 MINUTES PRIOR TO your MEALs.       No current facility-administered medications for this visit.   Past Medical History  Diagnosis Date  . Diabetes mellitus without complication   . Hypertension   . Neuropathy   . Arthritis   . Lower back pain   . Hepatitis C   . Mental disorder   . Depression    . Anxiety   . 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 Testicular pain, left 10/23/2012  . Abdominal pain 10/23/2012   Past Surgical History  Procedure Laterality Date  . Appendectomy      as child  . Colonoscopy with propofol  11/04/2012    ZOX:WRUEA internal hemorrhoids/ Mild diverticulosis was noted in the transverse colon/ 34 COLON polyps REMOVED, tubular adenomas, needs surveillance in 1-3 years  . Esophagogastroduodenoscopy (egd) with propofol  11/04/2012    VWU:JWJXBJ erosions at the GE junction/PATENT Stricture was found at the gastroesophageal junction/MODERATE gastritis/MODERATE DUODENITIS likely accounts for findings on CT. next surveillance in 3 years   . Esophageal biopsy  11/04/2012    Procedure: BIOPSY;  Surgeon: West Bali, MD;  Location: [facility];  Service: Endoscopy;  Laterality: N/A;  gastric  . Polypectomy  11/04/2012    Procedure: POLYPECTOMY;  Surgeon: West Bali, MD;  Location: [facility];  Service: Endoscopy;  Laterality: N/A;   No Known Allergies .med    Objective:   Physical Exam  Filed Vitals:   01/12/13 1450  BP: 122/50  Pulse: 88  Height: 6\' 4"  (1.93 m)  Weight: 217 lb (98.431 kg)  Alert and oriented. Skin warm and dry. Oral mucosa is moist.   . Sclera anicteric, conjunctivae is pink. Thyroid not enlarged. No cervical lymphadenopathy. Lungs clear. Heart regular rate and rhythm.  Abdomen is soft. Bowel sounds are positive. No hepatomegaly. No abdominal masses felt. No tenderness.  No edema to lower extremities.       Assessment:    Hepatitis C. Dr Karilyn Cota in with patient to discuss tx options.    Plan:    Liver biopsy for Hepatitis C. Hep B antigen, PT/INR. If liver biopsy shows Grade 1-2 will wait till new tx for hep C in the next  6 months. If stage 3-4, will tx now.  Rx given to Hepatitis A and B vaccine given to patient.

## 2013-01-13 LAB — PROTIME-INR: INR: 0.99 (ref <1.50)

## 2013-01-13 LAB — HEPATITIS B SURFACE ANTIGEN: Hepatitis B Surface Ag: NEGATIVE

## 2013-01-14 ENCOUNTER — Other Ambulatory Visit: Payer: Self-pay | Admitting: Radiology

## 2013-01-14 ENCOUNTER — Encounter: Payer: Self-pay | Admitting: Physical Therapy

## 2013-01-21 ENCOUNTER — Ambulatory Visit (HOSPITAL_COMMUNITY)
Admission: RE | Admit: 2013-01-21 | Discharge: 2013-01-21 | Disposition: A | Discharge status: Home / Self Care | Payer: Self-pay | Source: Ambulatory Visit | Attending: Internal Medicine | Admitting: Internal Medicine

## 2013-01-21 ENCOUNTER — Encounter: Payer: Self-pay | Admitting: Physical Therapy

## 2013-01-21 DIAGNOSIS — I10 Essential (primary) hypertension: Secondary | ICD-10-CM | POA: Insufficient documentation

## 2013-01-21 DIAGNOSIS — Z7982 Long term (current) use of aspirin: Secondary | ICD-10-CM | POA: Insufficient documentation

## 2013-01-21 DIAGNOSIS — F329 Major depressive disorder, single episode, unspecified: Secondary | ICD-10-CM | POA: Insufficient documentation

## 2013-01-21 DIAGNOSIS — F3289 Other specified depressive episodes: Secondary | ICD-10-CM | POA: Insufficient documentation

## 2013-01-21 DIAGNOSIS — F411 Generalized anxiety disorder: Secondary | ICD-10-CM | POA: Insufficient documentation

## 2013-01-21 DIAGNOSIS — G589 Mononeuropathy, unspecified: Secondary | ICD-10-CM | POA: Insufficient documentation

## 2013-01-21 DIAGNOSIS — K7460 Unspecified cirrhosis of liver: Secondary | ICD-10-CM | POA: Insufficient documentation

## 2013-01-21 DIAGNOSIS — E119 Type 2 diabetes mellitus without complications: Secondary | ICD-10-CM | POA: Insufficient documentation

## 2013-01-21 DIAGNOSIS — M539 Dorsopathy, unspecified: Secondary | ICD-10-CM | POA: Insufficient documentation

## 2013-01-21 DIAGNOSIS — B1920 Unspecified viral hepatitis C without hepatic coma: Secondary | ICD-10-CM | POA: Insufficient documentation

## 2013-01-21 DIAGNOSIS — Z79899 Other long term (current) drug therapy: Secondary | ICD-10-CM | POA: Insufficient documentation

## 2013-01-21 LAB — CBC
Hemoglobin: 15.5 g/dL (ref 13.0–17.0)
MCH: 33.4 pg (ref 26.0–34.0)
Platelets: 204 10*3/uL (ref 150–400)
RBC: 4.64 MIL/uL (ref 4.22–5.81)
WBC: 11.1 10*3/uL — ABNORMAL HIGH (ref 4.0–10.5)

## 2013-01-21 LAB — GLUCOSE, CAPILLARY: Glucose-Capillary: 341 mg/dL — ABNORMAL HIGH (ref 70–99)

## 2013-01-21 LAB — APTT: aPTT: 37 seconds (ref 24–37)

## 2013-01-21 LAB — PROTIME-INR: Prothrombin Time: 13.5 seconds (ref 11.6–15.2)

## 2013-01-21 MED ORDER — FENTANYL CITRATE 0.05 MG/ML IJ SOLN
INTRAMUSCULAR | Status: AC | PRN
  Administered 2013-01-21 (×2): 50 ug via INTRAVENOUS

## 2013-01-21 MED ORDER — HYDROCODONE-ACETAMINOPHEN 5-325 MG PO TABS
ORAL_TABLET | ORAL | Status: AC
  Filled 2013-01-21: qty 2

## 2013-01-21 MED ORDER — HYDROCODONE-ACETAMINOPHEN 5-325 MG PO TABS
1.0000 | ORAL_TABLET | ORAL | Status: DC | PRN
  Administered 2013-01-21: 2 via ORAL

## 2013-01-21 MED ORDER — MIDAZOLAM HCL 2 MG/2ML IJ SOLN
INTRAMUSCULAR | Status: AC | PRN
  Administered 2013-01-21 (×2): 1 mg via INTRAVENOUS

## 2013-01-21 MED ORDER — FENTANYL CITRATE 0.05 MG/ML IJ SOLN
INTRAMUSCULAR | Status: AC
  Filled 2013-01-21: qty 4

## 2013-01-21 MED ORDER — SODIUM CHLORIDE 0.9 % IV SOLN
Freq: Once | INTRAVENOUS | Status: AC
  Administered 2013-01-21: 09:00:00 via INTRAVENOUS

## 2013-01-21 MED ORDER — MIDAZOLAM HCL 2 MG/2ML IJ SOLN
INTRAMUSCULAR | Status: AC
  Filled 2013-01-21: qty 6

## 2013-01-21 NOTE — H&P (Signed)
Samuel [name] is an 57 y.o. male.   Chief Complaint: "I'm having a liver biopsy" [id number] with history of hepatitis C presents today for US guided random liver biopsy.  Past Medical History  Diagnosis Date  . Diabetes mellitus without complication   . Hypertension   . Neuropathy   . Arthritis   . Lower back pain   . Hepatitis C   . Mental disorder   . Depression   . Anxiety   . 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 Testicular pain, left 10/23/2012  . Abdominal pain 10/23/2012    Past Surgical History  Procedure Laterality Date  . Appendectomy      as child  . Colonoscopy with propofol  11/04/2012    AOZ:HYQMV internal hemorrhoids/ Mild diverticulosis was noted in the transverse colon/ 34 COLON polyps REMOVED, tubular adenomas, needs surveillance in 1-3 years  . Esophagogastroduodenoscopy (egd) with propofol  11/04/2012    HQI:ONGEXB erosions at the GE junction/PATENT Stricture was found at the gastroesophageal junction/MODERATE gastritis/MODERATE DUODENITIS likely accounts for findings on CT. next surveillance in 3 years   . Esophageal biopsy  11/04/2012    Procedure: BIOPSY;  Surgeon: West Bali, MD;  Location: [facility];  Service: Endoscopy;  Laterality: N/A;  gastric  . Polypectomy  11/04/2012    Procedure: POLYPECTOMY;  Surgeon: West Bali, MD;  Location: [facility];  Service: Endoscopy;  Laterality: N/A;    Family History  Problem Relation Age of Onset  . Colon cancer Neg Hx   . Colon polyps Neg Hx   . Lung cancer Son    Social History:  reports that he has been smoking Cigarettes.  He has a 20 pack-year smoking history. He does not have any smokeless tobacco history on file. He reports that he does not drink alcohol or use illicit drugs.  Allergies: No Known Allergies  Current outpatient prescriptions:amLODipine (NORVASC) 5 MG tablet, Take 5 mg by mouth daily., Disp: , Rfl: ;  aspirin 81 MG tablet, Take 81 mg by mouth daily., Disp: , Rfl: ;  glipiZIDE (GLUCOTROL) 10 MG tablet, Take 10  mg by mouth 2 (two) times daily., Disp: , Rfl: ;  Linaclotide (LINZESS) 290 MCG CAPS, Take 1 capsule by mouth daily., Disp: 30 capsule, Rfl: 11;  lisinopril (PRINIVIL,ZESTRIL) 20 MG tablet, Take 20 mg by mouth daily., Disp: , Rfl:  Menthol-Methyl Salicylate (ICY HOT EXTRA STRENGTH) 10-30 % CREA, Apply 1 application topically daily as needed. For pain, Disp: , Rfl: ;  metFORMIN (GLUCOPHAGE) 500 MG tablet, Take 500 mg by mouth 2 (two) times daily., Disp: , Rfl: ;  omeprazole (PRILOSEC) 20 MG capsule, 20 mg daily. TAKE 1 po bid 30 MINUTES PRIOR TO your MEALs., Disp: , Rfl:   Results for orders placed during the hospital encounter of 01/21/13  GLUCOSE, CAPILLARY      Result Value Range   Glucose-Capillary 341 (*) 70 - 99 mg/dL   2/84/13 CBC/PT/INR pending  Results for orders placed during the hospital encounter of 01/21/13 (from the past 48 hour(s))  GLUCOSE, CAPILLARY     Status: Abnormal   Collection Time    01/21/13  8:23 AM      Result Value Range   Glucose-Capillary 341 (*) 70 - 99 mg/dL   No results found.  Review of Systems  Constitutional: Negative for fever and chills.  Respiratory: Positive for cough. Negative for hemoptysis and shortness of breath.   Cardiovascular: Negative for chest pain.  Gastrointestinal: Negative for nausea, vomiting and  blood in stool.       Intermittent LLQ pain  Musculoskeletal: Negative for back pain.  Neurological: Negative for headaches.  Psychiatric/Behavioral: The patient is nervous/anxious.     Blood pressure 126/85, pulse 93, temperature 98.1 F (36.7 C), temperature source Oral, resp. rate 20, height 6\' 4"  (1.93 m), weight 205 lb (92.987 kg), SpO2 98.00%. Physical Exam  Constitutional: He is oriented to person, place, and time. He appears well-developed and well-nourished.  Cardiovascular: Normal rate and regular rhythm.   Respiratory: Effort normal and breath sounds normal.  GI: Soft. Bowel sounds are normal.  Mild LLQ tenderness   Musculoskeletal: Normal range of motion. He exhibits no edema.  Neurological: He is alert and oriented to person, place, and time.     Assessment/Plan Pt with hepatitis C. Plan is for US guided random core liver biopsy today. Details/risks of procedure d/w pt with his understanding and consent.  Hether Anselmo,D KEVIN 01/21/2013, 9:29 AM

## 2013-01-21 NOTE — H&P (Signed)
Agree with PA note.    Signed,  Nero Sawatzky K. Neira Bentsen, MD Vascular & Interventional Radiologist [facility]  

## 2013-01-21 NOTE — CV Procedure (Cosign Needed)
US guided random core liver biopsy performed (right hepatic lobe) x2 via 18 gauge needle. Path pending. No immediate complications. Medications utilized- 1% lidocaine to skin/SQ tissues; versed 2 mg IV, fentanyl 100 mcg IV.

## 2013-01-27 ENCOUNTER — Telehealth (INDEPENDENT_AMBULATORY_CARE_PROVIDER_SITE_OTHER): Payer: Self-pay | Admitting: *Deleted

## 2013-01-27 NOTE — Telephone Encounter (Signed)
Results given to patient. We are going to treat.

## 2013-01-27 NOTE — Telephone Encounter (Signed)
Would like to get his liver biopsy results. The return phone number is [phone].

## 2013-01-28 ENCOUNTER — Encounter: Payer: Self-pay | Admitting: Physical Therapy

## 2013-01-29 ENCOUNTER — Ambulatory Visit: Payer: Self-pay | Admitting: Gastroenterology

## 2013-02-02 ENCOUNTER — Ambulatory Visit: Payer: Self-pay | Admitting: Gastroenterology

## 2013-02-10 ENCOUNTER — Ambulatory Visit (INDEPENDENT_AMBULATORY_CARE_PROVIDER_SITE_OTHER): Payer: Self-pay | Admitting: Gastroenterology

## 2013-02-10 ENCOUNTER — Encounter: Payer: Self-pay | Admitting: Gastroenterology

## 2013-02-10 VITALS — BP 156/91 | HR 94 | Temp 97.4°F | Ht 75.0 in | Wt 221.4 lb

## 2013-02-10 DIAGNOSIS — B1920 Unspecified viral hepatitis C without hepatic coma: Secondary | ICD-10-CM

## 2013-02-10 DIAGNOSIS — R109 Unspecified abdominal pain: Secondary | ICD-10-CM

## 2013-02-10 DIAGNOSIS — K2980 Duodenitis without bleeding: Secondary | ICD-10-CM

## 2013-02-10 NOTE — Progress Notes (Signed)
Referring Provider: No ref. provider found Primary Care Physician:  No primary provider on file. Primary GI: Dr. Darrick Penna   Chief Complaint  Patient presents with  . Follow-up    HPI:   Samuel [name] returns today with a history of Hep C, possible early cirrhosis, chronic abdominal pain, constipation, and GERD. Thankfully, he has been evaluated for Hep C at Dr. Patty Sermons office. Due to lack of insurance, it was difficult obtaining a treatment facility that would accept him. He underwent liver biopsy for grading purposes. He is due for a repeat CT for reassessment of duodenum. If remains abnormal, needs enteroscopy. With known inguinal hernias on CT, it was thought left groin pain may be emanating from this. Referral to Gen Surgery was attempted, but due to insurance purposes, this has not been able to be completed. Has not had Hep A or B vaccinations.   Notes he has a BM every 2-3 days. Taking Linzess 290 mcg daily. Pain improved but constant. Feels his diet plays a large role in his constipation.   Past Medical History  Diagnosis Date  . Diabetes mellitus without complication   . Hypertension   . Neuropathy   . Arthritis   . Lower back pain   . Hepatitis C   . Mental disorder   . Depression   . Anxiety   . 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 Testicular pain, left 10/23/2012  . Abdominal pain 10/23/2012    Past Surgical History  Procedure Laterality Date  . Appendectomy      as child  . Colonoscopy with propofol  11/04/2012    ZOX:WRUEA internal hemorrhoids/ Mild diverticulosis was noted in the transverse colon/ 34 COLON polyps REMOVED, tubular adenomas, needs surveillance in 1-3 years  . Esophagogastroduodenoscopy (egd) with propofol  11/04/2012    VWU:JWJXBJ erosions at the GE junction/PATENT Stricture was found at the gastroesophageal junction/MODERATE gastritis/MODERATE DUODENITIS likely accounts for findings on CT. next surveillance in 3 years   . Esophageal biopsy  11/04/2012    Procedure:  BIOPSY;  Surgeon: West Bali, MD;  Location: [facility];  Service: Endoscopy;  Laterality: N/A;  gastric  . Polypectomy  11/04/2012    Procedure: POLYPECTOMY;  Surgeon: West Bali, MD;  Location: [facility];  Service: Endoscopy;  Laterality: N/A;    Current Outpatient Prescriptions  Medication Sig Dispense Refill  . amLODipine (NORVASC) 5 MG tablet Take 5 mg by mouth daily.      Marland Kitchen aspirin 81 MG tablet Take 81 mg by mouth daily.      Marland Kitchen glipiZIDE (GLUCOTROL) 10 MG tablet Take 10 mg by mouth 2 (two) times daily.      . Linaclotide (LINZESS) 290 MCG CAPS Take 1 capsule by mouth daily.  30 capsule  11  . lisinopril (PRINIVIL,ZESTRIL) 20 MG tablet Take 20 mg by mouth daily.      . Menthol-Methyl Salicylate (ICY HOT EXTRA STRENGTH) 10-30 % CREA Apply 1 application topically daily as needed. For pain      . metFORMIN (GLUCOPHAGE) 500 MG tablet Take 500 mg by mouth 2 (two) times daily.      Marland Kitchen omeprazole (PRILOSEC) 20 MG capsule 20 mg daily. TAKE 1 po bid 30 MINUTES PRIOR TO your MEALs.       No current facility-administered medications for this visit.    Allergies as of 02/10/2013  . (No Known Allergies)    Family History  Problem Relation Age of Onset  . Colon cancer Neg Hx   . Colon  polyps Neg Hx   . Lung cancer Son     History   Social History  . Marital Status: Single    Spouse Name: N/A    Number of Children: N/A  . Years of Education: N/A   Social History Main Topics  . Smoking status: Current Every Day Smoker -- 0.50 packs/day for 40 years    Types: Cigarettes  . Smokeless tobacco: None  . Alcohol Use: No     Comment: prior alcoholic  . Drug Use: No     Comment: used marijuana for pain one time last week  . Sexually Active: None   Other Topics Concern  . None   Social History Narrative  . None    Review of Systems: Negative unless mentioned in HPI.  Physical Exam: BP 156/91  Pulse 94  Temp(Src) 97.4 F (36.3 C) (Oral)  Ht 6\' 3"  (1.905 m)  Wt 221 lb 6.4 oz  (100.426 kg)  BMI 27.67 kg/m2 General:   Alert and oriented. No distress noted. Pleasant and cooperative.  Head:  Normocephalic and atraumatic. Eyes:  Conjuctiva clear without scleral icterus. Heart:  S1, S2 present without murmurs, rubs, or gallops. Regular rate and rhythm. Abdomen:  +BS, soft, mild TTP diffusely without rebound or guarding and non-distended.  Msk:  Symmetrical without gross deformities. Normal posture. Extremities:  Without edema. Neurologic:  Alert and  oriented x4;  grossly normal neurologically. Skin:  Intact without significant lesions or rashes. Psych:  Alert and cooperative. Normal mood and affect.

## 2013-02-10 NOTE — Patient Instructions (Addendum)
Continue taking Linzess daily. Follow a high fiber diet.   Please get Hepatitis A and B vaccination (I have given a prescription).  We have ordered a CT scan in the near future to make sure that area of inflammation of small intestine has resolved. DO NOT TAKE METFORMIN FOR 72 HOURS FOLLOWING CT SCAN. DO NOT TAKE MORNING OF SCAN. Please have blood work done to check your kidneys (we have to do this before the CT scan).   We will see you back in 6 months!

## 2013-02-11 DIAGNOSIS — K2980 Duodenitis without bleeding: Secondary | ICD-10-CM | POA: Insufficient documentation

## 2013-02-11 DIAGNOSIS — R109 Unspecified abdominal pain: Secondary | ICD-10-CM | POA: Insufficient documentation

## 2013-02-11 NOTE — Assessment & Plan Note (Addendum)
Treatment per Dr. Patty Sermons office. Twin rx prescription provided. Follow-up in 6 mos. Probably early cirrhosis; EGD up-to-date with surveillance due 2017

## 2013-02-11 NOTE — Assessment & Plan Note (Signed)
On prior CT; EGD with moderate duodenitis. Per prior recommendations, repeat CT due now to assess for persisting changes on CT. Needs enteroscopy if continued abnormality.

## 2013-02-11 NOTE — Assessment & Plan Note (Addendum)
Chronic, EGD and TCS up-to-date. Question component of inguinal hernias causing some discomfort, but unable to see surgery due to insurance purposes. Underlying IBS-C as well. Continue Linzess 290 mcg daily. Change dietary behaviors, discussed with patient.

## 2013-02-12 NOTE — Progress Notes (Signed)
No PCP on File 

## 2013-03-02 ENCOUNTER — Telehealth: Payer: Self-pay

## 2013-03-02 NOTE — Telephone Encounter (Signed)
Pt's friend, Jasmine December, called and said she was at [facility] in [place] and she thought he was supposed to have a prescription to pick up because he has appt at [facility] tomorrow. Per Soledad Gerlach, pt is scheduled for CT tomorrow at 11:00 AM and needs to go by radiology today to pick up the contrast. Jasmine December is aware and will tell pt to do so.

## 2013-03-03 ENCOUNTER — Ambulatory Visit (HOSPITAL_COMMUNITY)
Admission: RE | Admit: 2013-03-03 | Discharge: 2013-03-03 | Disposition: A | Discharge status: Home / Self Care | Payer: Self-pay | Source: Ambulatory Visit | Attending: Gastroenterology | Admitting: Gastroenterology

## 2013-03-03 DIAGNOSIS — K5730 Diverticulosis of large intestine without perforation or abscess without bleeding: Secondary | ICD-10-CM | POA: Insufficient documentation

## 2013-03-03 DIAGNOSIS — R1032 Left lower quadrant pain: Secondary | ICD-10-CM | POA: Insufficient documentation

## 2013-03-03 DIAGNOSIS — K2980 Duodenitis without bleeding: Secondary | ICD-10-CM

## 2013-03-03 DIAGNOSIS — K449 Diaphragmatic hernia without obstruction or gangrene: Secondary | ICD-10-CM | POA: Insufficient documentation

## 2013-03-03 LAB — BASIC METABOLIC PANEL
CO2: 26 mEq/L (ref 19–32)
Calcium: 9.6 mg/dL (ref 8.4–10.5)
Creat: 0.58 mg/dL (ref 0.50–1.35)
Sodium: 134 mEq/L — ABNORMAL LOW (ref 135–145)

## 2013-03-03 MED ORDER — IOHEXOL 300 MG/ML  SOLN
100.0000 mL | Freq: Once | INTRAMUSCULAR | Status: AC | PRN
  Administered 2013-03-03: 100 mL via INTRAVENOUS

## 2013-03-09 ENCOUNTER — Telehealth: Payer: Self-pay

## 2013-03-09 NOTE — Telephone Encounter (Signed)
Called and informed pt.  

## 2013-03-09 NOTE — Telephone Encounter (Signed)
Known inguinal hernias. I am not convinced this is causing discomfort. We can refer him again to General Surgery for their opinion. May need Pain Management.

## 2013-03-09 NOTE — Telephone Encounter (Signed)
I sent a result note. Thanks!

## 2013-03-09 NOTE — Telephone Encounter (Signed)
Pt left VM requesting CT results.

## 2013-03-09 NOTE — Progress Notes (Signed)
Quick Note:  CT reviewed:  Duodenitis resolved.  He does not need further studies/work-up for this.  Keep appt for 6 months. ______

## 2013-03-09 NOTE — Progress Notes (Signed)
Called and informed pt.  

## 2013-03-09 NOTE — Telephone Encounter (Signed)
I had called and gave pt results. Shortly after Jasmine December called to say that he is no better. That his pain is not as bad but still around a 5-6 most of the time. She asked about a Careers adviser for the hiatal hernia. She said that he could not be referred previously, because no one would accept him because he has [employer] Assistance. Please advise!

## 2013-03-09 NOTE — Telephone Encounter (Signed)
None of the General Surgeons accepts the cone assistance

## 2013-03-11 NOTE — Telephone Encounter (Signed)
Called and told pt that Samuel [name] is working on trying to locate a Careers adviser that will accept the cone assistance. We will let him know when we hear something.

## 2013-03-21 NOTE — Progress Notes (Signed)
  REVIEWED.  CT A/P JUN 2014: DUODENITIS RESOLVED.

## 2013-03-24 ENCOUNTER — Telehealth (INDEPENDENT_AMBULATORY_CARE_PROVIDER_SITE_OTHER): Payer: Self-pay | Admitting: *Deleted

## 2013-03-24 NOTE — Telephone Encounter (Signed)
I called and talked with the patient this morning. I ask if he could come in this week and complete his part of the application for assistance, and bring paper work that is requested . He agreed to come in tomorrow 03-25-13 , he will also have his lab work drawn at that time. (TSH per Delrae Rend)

## 2013-03-24 NOTE — Telephone Encounter (Signed)
This lab is being requested by Delrae Rend prior to the patient starting Hep C treatment.

## 2013-03-26 LAB — TSH: TSH: 1.21 u[IU]/mL (ref 0.350–4.500)

## 2013-04-15 ENCOUNTER — Telehealth (INDEPENDENT_AMBULATORY_CARE_PROVIDER_SITE_OTHER): Payer: Self-pay | Admitting: *Deleted

## 2013-04-15 NOTE — Telephone Encounter (Signed)
Samuel [name] has received Sovaldi in the mail. This is the only one so far received. He was to call and let Terri know and return phone number is [phone].

## 2013-04-15 NOTE — Telephone Encounter (Signed)
He has received one month's supply of Sovaldi. He has not received his other drugs. He will let us know when he receives the rest.

## 2013-04-20 NOTE — Telephone Encounter (Signed)
Noted. I will call the insurance company on this later this week.

## 2013-05-25 ENCOUNTER — Telehealth (INDEPENDENT_AMBULATORY_CARE_PROVIDER_SITE_OTHER): Payer: Self-pay | Admitting: *Deleted

## 2013-05-25 NOTE — Telephone Encounter (Signed)
I will have to call the insurance company and see, then the patient will be called back. I will call and let Samuel [name] know this.

## 2013-05-25 NOTE — Telephone Encounter (Signed)
Jasmine December and [place] would like to know when he should be exspecting the rest of his medicines for Hep C treatment. He has only receiveed theSovaldi about 3 weeks ago. Math has not taken any medicine at this time.  Sharon's return phone number is [phone] and Teren is [phone].

## 2013-05-28 NOTE — Telephone Encounter (Signed)
I have talked with the patient 's wife. She states that have only rec'd 1 bottle of medication and this is the Bagnell. I have called the foundation that he was approved for their services Mendel Ryder) and spoke with Trula Ore as I had reviewed my documentation that I had talked with them on 04-06-13 and per Chyrl Civatte they were calling the patient to arrange the shipment of Peg and Co Peg to the patient. She states that they called and called the patient leaving messages for him to call them and that they had exhausted all there allotted attempts to reach out to the patient. I called the patient back and spoke with his wife, told her all of the above and provided the necessary information for her to call them back today. I also ask that she call our office back to make [place] aware of estimated time he would get the medications so that we may arrange for him to come in and go over the med's/side effects, rect. She agreed to do so.  Forwarded to Dorene Ar, NP as Lorain Childes and verification of dosage of Peg and Ribavirin

## 2013-06-10 ENCOUNTER — Encounter (INDEPENDENT_AMBULATORY_CARE_PROVIDER_SITE_OTHER): Payer: Self-pay | Admitting: Internal Medicine

## 2013-06-10 ENCOUNTER — Ambulatory Visit (INDEPENDENT_AMBULATORY_CARE_PROVIDER_SITE_OTHER): Payer: Self-pay | Admitting: Internal Medicine

## 2013-06-10 VITALS — BP 126/58 | HR 84 | Temp 98.7°F | Ht 76.0 in | Wt 209.7 lb

## 2013-06-10 DIAGNOSIS — Z79899 Other long term (current) drug therapy: Secondary | ICD-10-CM

## 2013-06-10 DIAGNOSIS — B1920 Unspecified viral hepatitis C without hepatic coma: Secondary | ICD-10-CM

## 2013-06-10 NOTE — Progress Notes (Signed)
Subjective:     Patient ID: Samuel [name], male   DOB: 11/10/1955, 57 y.o.   MRN: [id number]  HPI Here today for f/u of his Hepatitis C. He is Genotype 1a.  01/22/2013 Liver Biopsy; Chronic active Hepatitis consistent Hepatitis C with focal bridging fibrosis. Inflammatory grade 11 (mild) and fibrosis stage 11.  No hx of tattoos. No IV drug use. He did do IV drugs x 1.  No hx of major depression. Diagnosed with Hepatitis C 2 yrs ago at the [facility] in [place]. He has not drank in 7 yrs.  He is not taking any medication at this time.  He will be treated with Solvadi 400mg  po daily, Ribavarin 1200mg  po  day. Pegasys weekly He will be treated for 12 weeks.  Recently lost his son in November of last year. He denies being depressed. He is grieving over his son.  He has not gone to the Health Dept for the Hepatitis A and B vaccine.   CBC    Component Value Date/Time   WBC 11.1* 01/21/2013 0813   RBC 4.64 01/21/2013 0813   HGB 15.5 01/21/2013 0813   HCT 40.9 01/21/2013 0813   PLT 204 01/21/2013 0813   MCV 88.1 01/21/2013 0813   MCH 33.4 01/21/2013 0813   MCHC 37.9* 01/21/2013 0813   RDW 13.0 01/21/2013 0813   LYMPHSABS 4.9* 11/06/2012 1642   MONOABS 0.8 11/06/2012 1642   EOSABS 0.7 11/06/2012 1642   BASOSABS 0.1 11/06/2012 1642   CMP     Component Value Date/Time   NA 134* 02/10/2013 1022   K 3.7 02/10/2013 1022   CL 98 02/10/2013 1022   CO2 26 02/10/2013 1022   GLUCOSE 288* 02/10/2013 1022   BUN 10 02/10/2013 1022   CREATININE 0.58 02/10/2013 1022   CREATININE 0.56 11/03/2012 1300   CALCIUM 9.6 02/10/2013 1022   PROT 8.1 10/08/2012 1458   ALBUMIN 4.2 10/08/2012 1458   AST 57* 10/08/2012 1458   ALT 111* 10/08/2012 1458   ALKPHOS 79 10/08/2012 1458   BILITOT 0.6 10/08/2012 1458   GFRNONAA >90 11/03/2012 1300   GFRAA >90 11/03/2012 1300    6/11/06/2012 TSH: 1.210.   Review of Systems see hpi  Patient is not currently on any medication. He stopped all of his medications.  .     Objective:   Physical Exam  Filed Vitals:   06/10/13 1415  BP: 126/58  Pulse: 84  Temp: 98.7 F (37.1 C)  Height: 6\' 4"  (1.93 m)  Weight: 209 lb 11.2 oz (95.119 kg)   Alert and oriented. Skin warm and dry. Oral mucosa is moist.   . Sclera anicteric, conjunctivae is pink. Thyroid not enlarged. No cervical lymphadenopathy. Lungs clear. Heart regular rate and rhythm.  Abdomen is soft. Bowel sounds are positive. No hepatomegaly. No abdominal masses felt. No tenderness.  No edema to lower extremities.       Assessment:    Hepatitis C. Genotype 1A.  He will be treated with Solvaldi 400mg  daily, Pegasys weekly, and Ribavirin 1200mg  daily x 12 week.    Plan:     CBC, AFP and Cmet today.  He does not have recent labs and we need a base line.  CBC, cmet  2 weeks after treatment started. He was advised to start all of his diabetic medications and his Lisinopril.  He also needs to go to the health dept to get his Hepatitis A and B vaccine.

## 2013-06-10 NOTE — Patient Instructions (Addendum)
CBC, AFP, CMET today. Will get a CBC and CMet 2 weeks after treatement is started. He was advised today to start the Glucophage, Metformin and the Lisinopril

## 2013-06-10 NOTE — Progress Notes (Addendum)
Samuel [name]  presented to me for Hepatitis C teaching today.Patient is Genotype 1a and the course of treatment is for 12 weeks with the triple therapy. We reviewed all of the possible side effects and the treatments for them. We reviewed the 2 oral  Medications,Ribiavirin 200 mg tablet , he will take 3 by mouth twice a day with a meal. The Sovaldi 400 mg tablet he will take 1 by mouth daily. We reviewed the Pegasys, injection is 180 mcg SQ weekly on the same day each week,how to prepare the syringe , and the appropriate injection sites. Samuel [name] states that he wants to wait until Monday,September 15 or the following Monday , September 22, as this is his support person's day off. He states that he will call me when he plans to take injection and start the oral medications. Then he will call me and leave a message for me on the evening he starts the Hepatitis C treatment. Patient was advised that it is very important that he does as his lab work will need to be drawn 2 weeks after treatment started, per Dorene Ar, NP he will need to have CBC, C-Met at that time. He verbalized understanding on the teaching and lab work. Patient was given my private number as point of contact. Samuel [name] was advised to call me with any questions and or concerns. He saw Terri prior to me and she was having patient to go to the lab today for some lab work , he was advised that Camelia Eng would call him with those results. His next lab work/office visit will be generated at the time I receive his start date.

## 2013-07-14 ENCOUNTER — Ambulatory Visit (INDEPENDENT_AMBULATORY_CARE_PROVIDER_SITE_OTHER): Payer: Self-pay | Admitting: Internal Medicine

## 2013-07-25 NOTE — Progress Notes (Signed)
REVIEWED.  

## 2013-08-12 ENCOUNTER — Encounter: Payer: Self-pay | Admitting: Gastroenterology

## 2013-08-19 ENCOUNTER — Encounter (HOSPITAL_COMMUNITY): Payer: Self-pay | Admitting: Emergency Medicine

## 2013-08-19 ENCOUNTER — Emergency Department (HOSPITAL_COMMUNITY): Payer: Medicaid - Out of State

## 2013-08-19 ENCOUNTER — Emergency Department (HOSPITAL_COMMUNITY)
Admission: EM | Admit: 2013-08-19 | Discharge: 2013-08-19 | Disposition: A | Discharge status: Home / Self Care | Payer: Medicaid - Out of State | Attending: Emergency Medicine | Admitting: Emergency Medicine

## 2013-08-19 DIAGNOSIS — Z860100 Personal history of colon polyps, unspecified: Secondary | ICD-10-CM | POA: Insufficient documentation

## 2013-08-19 DIAGNOSIS — Z79899 Other long term (current) drug therapy: Secondary | ICD-10-CM | POA: Insufficient documentation

## 2013-08-19 DIAGNOSIS — R109 Unspecified abdominal pain: Secondary | ICD-10-CM

## 2013-08-19 DIAGNOSIS — M546 Pain in thoracic spine: Secondary | ICD-10-CM | POA: Insufficient documentation

## 2013-08-19 DIAGNOSIS — M129 Arthropathy, unspecified: Secondary | ICD-10-CM | POA: Insufficient documentation

## 2013-08-19 DIAGNOSIS — R319 Hematuria, unspecified: Secondary | ICD-10-CM | POA: Insufficient documentation

## 2013-08-19 DIAGNOSIS — E119 Type 2 diabetes mellitus without complications: Secondary | ICD-10-CM | POA: Insufficient documentation

## 2013-08-19 DIAGNOSIS — Z8669 Personal history of other diseases of the nervous system and sense organs: Secondary | ICD-10-CM | POA: Insufficient documentation

## 2013-08-19 DIAGNOSIS — I10 Essential (primary) hypertension: Secondary | ICD-10-CM | POA: Insufficient documentation

## 2013-08-19 DIAGNOSIS — Z8659 Personal history of other mental and behavioral disorders: Secondary | ICD-10-CM | POA: Insufficient documentation

## 2013-08-19 DIAGNOSIS — R062 Wheezing: Secondary | ICD-10-CM | POA: Insufficient documentation

## 2013-08-19 DIAGNOSIS — Z8619 Personal history of other infectious and parasitic diseases: Secondary | ICD-10-CM | POA: Insufficient documentation

## 2013-08-19 DIAGNOSIS — Z9889 Other specified postprocedural states: Secondary | ICD-10-CM | POA: Insufficient documentation

## 2013-08-19 DIAGNOSIS — Z8719 Personal history of other diseases of the digestive system: Secondary | ICD-10-CM | POA: Insufficient documentation

## 2013-08-19 DIAGNOSIS — R1031 Right lower quadrant pain: Secondary | ICD-10-CM | POA: Insufficient documentation

## 2013-08-19 DIAGNOSIS — F17200 Nicotine dependence, unspecified, uncomplicated: Secondary | ICD-10-CM | POA: Insufficient documentation

## 2013-08-19 LAB — URINALYSIS, ROUTINE W REFLEX MICROSCOPIC
Bilirubin Urine: NEGATIVE
Ketones, ur: NEGATIVE mg/dL
Leukocytes, UA: NEGATIVE
Nitrite: NEGATIVE
pH: 6 (ref 5.0–8.0)

## 2013-08-19 LAB — CBC WITH DIFFERENTIAL/PLATELET
Eosinophils Absolute: 0.3 10*3/uL (ref 0.0–0.7)
Eosinophils Relative: 4 % (ref 0–5)
HCT: 36.1 % — ABNORMAL LOW (ref 39.0–52.0)
Lymphocytes Relative: 37 % (ref 12–46)
Lymphs Abs: 2.7 10*3/uL (ref 0.7–4.0)
MCH: 32.6 pg (ref 26.0–34.0)
MCV: 89.1 fL (ref 78.0–100.0)
Monocytes Absolute: 0.6 10*3/uL (ref 0.1–1.0)
RBC: 4.05 MIL/uL — ABNORMAL LOW (ref 4.22–5.81)
RDW: 12.9 % (ref 11.5–15.5)
WBC: 7.2 10*3/uL (ref 4.0–10.5)

## 2013-08-19 LAB — COMPREHENSIVE METABOLIC PANEL
CO2: 25 mEq/L (ref 19–32)
Calcium: 9.6 mg/dL (ref 8.4–10.5)
Creatinine, Ser: 0.58 mg/dL (ref 0.50–1.35)
GFR calc Af Amer: >90 mL/min (ref >90)
GFR calc non Af Amer: >90 mL/min (ref >90)
Glucose, Bld: 236 mg/dL — ABNORMAL HIGH (ref 70–99)
Total Protein: 7.5 g/dL (ref 6.0–8.3)

## 2013-08-19 MED ORDER — HYDROCODONE-ACETAMINOPHEN 5-325 MG PO TABS: 1.0000 | ORAL_TABLET | Freq: Four times a day (QID) | ORAL | Status: DC | PRN

## 2013-08-19 MED ORDER — KETOROLAC TROMETHAMINE 60 MG/2ML IM SOLN
60.0000 mg | Freq: Once | INTRAMUSCULAR | Status: AC
  Administered 2013-08-19: 60 mg via INTRAMUSCULAR
  Filled 2013-08-19: qty 2

## 2013-08-19 NOTE — ED Provider Notes (Signed)
CSN: [id number]     Arrival date & time 08/19/13  1215 History  This chart was scribed for Samuel Lennert, MD by Valera Castle, ED Scribe. This patient was seen in room APA14/[facility]14 and the patient's care was started at 1:46 PM.   No chief complaint on file.  (Consider location/radiation/quality/duration/timing/severity/associated sxs/prior Treatment) Patient is a 57 y.o. male presenting with hematuria. The history is provided by the patient. No language interpreter was used.  Hematuria This is a new problem. The current episode started more than 1 week ago. The problem occurs daily. The problem has been gradually improving. Associated symptoms include abdominal pain (RLQ, right flank). Pertinent negatives include no chest pain and no headaches. Nothing aggravates the symptoms. Nothing relieves the symptoms.   HPI Comments: Samuel [name] is a 57 y.o. male with a h/o Hep-C who presents to the Emergency Department complaining of sudden, intermittent hematuria, onset 2 weeks ago. He reports bilateral mid back pain, around his kidney area, as well as lower, left abdominal pain, onset since the hematuria. He states the hematuria has been improving. He reports some associated wheezing. He reports being seen here last year for severe, lower, left abdominal pain. He reports he had 24 polyps taken out from his colon successfully. He denies any other associated symptoms.    PCP - No PCP Per Patient  Past Medical History  Diagnosis Date  . Diabetes mellitus without complication   . Hypertension   . Neuropathy   . Arthritis   . Lower back pain   . Hepatitis C   . Mental disorder   . Depression   . Anxiety   . 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 Testicular pain, left 10/23/2012  . Abdominal pain 10/23/2012   Past Surgical History  Procedure Laterality Date  . Appendectomy      as child  . Colonoscopy with propofol  11/04/2012    NWG:NFAOZ internal hemorrhoids/ Mild diverticulosis was noted in the transverse  colon/ 34 COLON polyps REMOVED, tubular adenomas, needs surveillance in 1-3 years  . Esophagogastroduodenoscopy (egd) with propofol  11/04/2012    HYQ:MVHQIO erosions at the GE junction/PATENT Stricture was found at the gastroesophageal junction/MODERATE gastritis/MODERATE DUODENITIS likely accounts for findings on CT. next surveillance in 3 years   . Esophageal biopsy  11/04/2012    Procedure: BIOPSY;  Surgeon: West Bali, MD;  Location: [facility];  Service: Endoscopy;  Laterality: N/A;  gastric  . Polypectomy  11/04/2012    Procedure: POLYPECTOMY;  Surgeon: West Bali, MD;  Location: [facility];  Service: Endoscopy;  Laterality: N/A;   Family History  Problem Relation Age of Onset  . Colon cancer Neg Hx   . Colon polyps Neg Hx   . Lung cancer Son    History  Substance Use Topics  . Smoking status: Current Every Day Smoker -- 0.50 packs/day for 40 years    Types: Cigarettes  . Smokeless tobacco: Not on file  . Alcohol Use: No     Comment: prior alcoholic    Review of Systems  Constitutional: Negative for appetite change and fatigue.  HENT: Negative for congestion, ear discharge and sinus pressure.   Eyes: Negative for discharge.  Respiratory: Positive for wheezing. Negative for cough.   Cardiovascular: Negative for chest pain.  Gastrointestinal: Positive for abdominal pain (RLQ, right flank). Negative for diarrhea.  Genitourinary: Positive for hematuria. Negative for frequency.  Musculoskeletal: Negative for back pain.  Skin: Negative for rash.  Neurological: Negative for seizures and  headaches.  Psychiatric/Behavioral: Negative for hallucinations.    Allergies  Review of patient's allergies indicates no known allergies.  Home Medications   Current Outpatient Rx  Name  Route  Sig  Dispense  Refill  . amLODipine (NORVASC) 5 MG tablet   Oral   Take 5 mg by mouth daily.         Marland Kitchen glipiZIDE (GLUCOTROL) 10 MG tablet   Oral   Take 10 mg by mouth daily.          Marland Kitchen  ibuprofen (ADVIL,MOTRIN) 200 MG tablet   Oral   Take 400 mg by mouth every 6 (six) hours as needed for mild pain or moderate pain.         Marland Kitchen lisinopril (PRINIVIL,ZESTRIL) 20 MG tablet   Oral   Take 20 mg by mouth daily.         . Menthol-Methyl Salicylate (ICY HOT EXTRA STRENGTH) 10-30 % CREA   Apply externally   Apply 1 application topically daily as needed. For pain         . metFORMIN (GLUCOPHAGE) 500 MG tablet   Oral   Take 500 mg by mouth 2 (two) times daily.         . peginterferon alfa-2a (PEGASYS) 180 MCG/ML injection   Subcutaneous   Inject 180 mcg into the skin every 7 (seven) days.         . ribavirin (COPEGUS) 200 MG tablet   Oral   Take 600 mg/day by mouth 2 (two) times daily.          . Sofosbuvir (SOVALDI) 400 MG TABS   Oral   Take 400 mg by mouth.          BP 162/92  Pulse 129  Temp(Src) 98.5 F (36.9 C) (Oral)  Resp 20  Ht 6\' 4"  (1.93 m)  Wt 205 lb (92.987 kg)  BMI 24.96 kg/m2  SpO2 99%  Physical Exam  Nursing note and vitals reviewed. Constitutional: He is oriented to person, place, and time. He appears well-developed.  HENT:  Head: Normocephalic.  Eyes: Conjunctivae and EOM are normal. No scleral icterus.  Neck: Neck supple. No thyromegaly present.  Cardiovascular: Normal rate and regular rhythm.  Exam reveals no gallop and no friction rub.   No murmur heard. Pulmonary/Chest: No stridor. He has no wheezes. He has no rales. He exhibits no tenderness.  Abdominal: Soft. He exhibits no distension. There is tenderness. There is no rebound.  Moderate RLQ and right flank tenderness to palpation.  Musculoskeletal: Normal range of motion. He exhibits no edema.  Lymphadenopathy:    He has no cervical adenopathy.  Neurological: He is oriented to person, place, and time. He exhibits normal muscle tone. Coordination normal.  Skin: No rash noted. No erythema.  Psychiatric: He has a normal mood and affect. His behavior is normal.    ED  Course  Procedures (including critical care time)  DIAGNOSTIC STUDIES: Oxygen Saturation is 99% on room air, normal by my interpretation.    COORDINATION OF CARE: 1:49 PM-Discussed treatment plan which includes CBC panel, CMP, and UA with pt at bedside and pt agreed to plan.   Labs Review Labs Reviewed  URINALYSIS, ROUTINE W REFLEX MICROSCOPIC - Abnormal; Notable for the following:    Glucose, UA >1000 (*)    Protein, ur TRACE (*)    Urobilinogen, UA 4.0 (*)    All other components within normal limits  CBC WITH DIFFERENTIAL - Abnormal; Notable for the following:  RBC 4.05 (*)    HCT 36.1 (*)    MCHC 36.6 (*)    Platelets 149 (*)    All other components within normal limits  COMPREHENSIVE METABOLIC PANEL - Abnormal; Notable for the following:    Glucose, Bld 236 (*)    All other components within normal limits  URINE MICROSCOPIC-ADD ON   Imaging Review No results found.  EKG Interpretation   None      Results for orders placed during the hospital encounter of 08/19/13  URINALYSIS, ROUTINE W REFLEX MICROSCOPIC      Result Value Range   Color, Urine YELLOW  YELLOW   APPearance CLEAR  CLEAR   Specific Gravity, Urine 1.025  1.005 - 1.030   pH 6.0  5.0 - 8.0   Glucose, UA >1000 (*) NEGATIVE mg/dL   Hgb urine dipstick NEGATIVE  NEGATIVE   Bilirubin Urine NEGATIVE  NEGATIVE   Ketones, ur NEGATIVE  NEGATIVE mg/dL   Protein, ur TRACE (*) NEGATIVE mg/dL   Urobilinogen, UA 4.0 (*) 0.0 - 1.0 mg/dL   Nitrite NEGATIVE  NEGATIVE   Leukocytes, UA NEGATIVE  NEGATIVE  URINE MICROSCOPIC-ADD ON      Result Value Range   WBC, UA 0-2  <3 WBC/hpf   RBC / HPF 0-2  <3 RBC/hpf  CBC WITH DIFFERENTIAL      Result Value Range   WBC 7.2  4.0 - 10.5 K/uL   RBC 4.05 (*) 4.22 - 5.81 MIL/uL   Hemoglobin 13.2  13.0 - 17.0 g/dL   HCT 16.1 (*) 09.6 - 04.5 %   MCV 89.1  78.0 - 100.0 fL   MCH 32.6  26.0 - 34.0 pg   MCHC 36.6 (*) 30.0 - 36.0 g/dL   RDW 40.9  81.1 - 91.4 %   Platelets 149  (*) 150 - 400 K/uL   Neutrophils Relative % 50  43 - 77 %   Neutro Abs 3.6  1.7 - 7.7 K/uL   Lymphocytes Relative 37  12 - 46 %   Lymphs Abs 2.7  0.7 - 4.0 K/uL   Monocytes Relative 9  3 - 12 %   Monocytes Absolute 0.6  0.1 - 1.0 K/uL   Eosinophils Relative 4  0 - 5 %   Eosinophils Absolute 0.3  0.0 - 0.7 K/uL   Basophils Relative 0  0 - 1 %   Basophils Absolute 0.0  0.0 - 0.1 K/uL  COMPREHENSIVE METABOLIC PANEL      Result Value Range   Sodium 135  135 - 145 mEq/L   Potassium 3.6  3.5 - 5.1 mEq/L   Chloride 99  96 - 112 mEq/L   CO2 25  19 - 32 mEq/L   Glucose, Bld 236 (*) 70 - 99 mg/dL   BUN 14  6 - 23 mg/dL   Creatinine, Ser 7.82  0.50 - 1.35 mg/dL   Calcium 9.6  8.4 - 95.6 mg/dL   Total Protein 7.5  6.0 - 8.3 g/dL   Albumin 3.8  3.5 - 5.2 g/dL   AST 35  0 - 37 U/L   ALT 53  0 - 53 U/L   Alkaline Phosphatase 82  39 - 117 U/L   Total Bilirubin 1.2  0.3 - 1.2 mg/dL   GFR calc non Af Amer >90  >90 mL/min   GFR calc Af Amer >90  >90 mL/min    MDM  No diagnosis found. Abdominal pain.  Possible passed kidney stone.  Pt  to follow up     The chart was scribed for me under my direct supervision.  I personally performed the history, physical, and medical decision making and all procedures in the evaluation of this patient.Samuel Lennert, MD 08/19/13 [phone]

## 2013-08-19 NOTE — ED Notes (Signed)
Pt reports started treatment for hep c 2 weeks ago.  C/O pain in kidneys and has had hematuria prior to hepatitis medication.

## 2013-08-19 NOTE — ED Notes (Signed)
Patient given discharge instruction, verbalized understand. Patient ambulatory out of the department.  

## 2013-08-19 NOTE — ED Notes (Signed)
MD at the bedside  

## 2013-09-03 ENCOUNTER — Telehealth (INDEPENDENT_AMBULATORY_CARE_PROVIDER_SITE_OTHER): Payer: Self-pay | Admitting: *Deleted

## 2013-09-03 NOTE — Telephone Encounter (Signed)
Only has one pill left of his SOVALDI. Jasmine December needs to talk with someone if they would please return her call to [phone] or [phone].

## 2013-09-04 ENCOUNTER — Telehealth (INDEPENDENT_AMBULATORY_CARE_PROVIDER_SITE_OTHER): Payer: Self-pay | Admitting: *Deleted

## 2013-09-04 NOTE — Telephone Encounter (Signed)
Talked was given was patient's friend. Patient is feeling better today. I advised that he should proceed with Pegasys injection on Sunday. He has an office visit next Monday her son will discuss treatment options if he is having significant side effects.

## 2013-09-04 NOTE — Telephone Encounter (Signed)
Please schedule him for an office visit.

## 2013-09-04 NOTE — Telephone Encounter (Signed)
Open In error, prevoius note is sufficient

## 2013-09-04 NOTE — Telephone Encounter (Signed)
I called the patient this morning. I spoke with Jasmine December, support person. She states that the patient started treatment on November the 9th. The patient had presented to the office in September for his treatment and I that time he gave two dates that he might start the treatment. Mr Lipinski told me that he was planning a trip with his mother and family and wanted to wait until afterwards so that if he had side effects it would not bother the trip. Our office did not know until 09/03/13, that he had started treatment.Jasmine December says that he has had some problems with his eye sight,dizziness,dark urine. He was seen in the ED @ [facility] ,Reubin Milan was told that he had Kidney Stones. I ask why we were not made aware, and the response was they didn't know. I told Jasmine December that I was going to let Delrae Rend ,Provider know and that we would get back in touch with her about lab work, office visit. Terri , please advise what the patient needs.

## 2013-09-07 ENCOUNTER — Encounter (INDEPENDENT_AMBULATORY_CARE_PROVIDER_SITE_OTHER): Payer: Self-pay | Admitting: Internal Medicine

## 2013-09-07 ENCOUNTER — Ambulatory Visit (INDEPENDENT_AMBULATORY_CARE_PROVIDER_SITE_OTHER): Payer: Self-pay | Admitting: Internal Medicine

## 2013-09-07 VITALS — BP 108/48 | HR 64 | Temp 98.8°F | Ht 76.0 in | Wt 218.8 lb

## 2013-09-07 DIAGNOSIS — B1920 Unspecified viral hepatitis C without hepatic coma: Secondary | ICD-10-CM

## 2013-09-07 LAB — CBC WITH DIFFERENTIAL/PLATELET
Basophils Relative: 0 % (ref 0–1)
Hemoglobin: 12.2 g/dL — ABNORMAL LOW (ref 13.0–17.0)
Lymphocytes Relative: 44 % (ref 12–46)
Lymphs Abs: 2.7 10*3/uL (ref 0.7–4.0)
Monocytes Relative: 7 % (ref 3–12)
Neutro Abs: 2.9 10*3/uL (ref 1.7–7.7)
Neutrophils Relative %: 47 % (ref 43–77)
RBC: 3.83 MIL/uL — ABNORMAL LOW (ref 4.22–5.81)
WBC: 6.1 10*3/uL (ref 4.0–10.5)

## 2013-09-07 NOTE — Progress Notes (Addendum)
Subjective:     Patient ID: Samuel [name], male   DOB: 23-Nov-1955, 57 y.o.   MRN: [id number]  HPI Here today for f/u of his Hepatitis C. He is Genotype 1A. He is being treated with Solvaldi 400mg , Ribavarin daily, and pegasys weekly,. He started treatment on 08/02/2013. This is week 5 for him.  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 Appetite is good. No weight loss. Does c/o fatigue. No joint pain. Sister in law who is a Engineer, civil (consulting) is giving him his Pegasys. No rash. Did have some bruising at 1st injection site.  He [facility] not [facility] to [facility] A and B vaccine. I did advise him today to wait till he finishes treatment. He says he is not driving much because sometimes he is dizzy.  Recently in the ED with a kidney stone and did have some hematura which has now cleared. CBC    Component Value Date/Time   WBC 7.2 08/19/2013 1400   RBC 4.05* 08/19/2013 1400   HGB 13.2 08/19/2013 1400   HCT 36.1* 08/19/2013 1400   PLT 149* 08/19/2013 1400   MCV 89.1 08/19/2013 1400   MCH 32.6 08/19/2013 1400   MCHC 36.6* 08/19/2013 1400   RDW 12.9 08/19/2013 1400   LYMPHSABS 2.7 08/19/2013 1400   MONOABS 0.6 08/19/2013 1400   EOSABS 0.3 08/19/2013 1400   BASOSABS 0.0 08/19/2013 1400   ANA 3.6 CMP     Component Value Date/Time   NA 135 08/19/2013 1400   K 3.6 08/19/2013 1400   CL 99 08/19/2013 1400   CO2 25 08/19/2013 1400   GLUCOSE 236* 08/19/2013 1400   BUN 14 08/19/2013 1400   CREATININE 0.58 08/19/2013 1400   CREATININE 0.58 02/10/2013 1022   CALCIUM 9.6 08/19/2013 1400   PROT 7.5 08/19/2013 1400   ALBUMIN 3.8 08/19/2013 1400   AST 35 08/19/2013 1400   ALT 53 08/19/2013 1400   ALKPHOS 82 08/19/2013 1400   BILITOT 1.2 08/19/2013 1400   GFRNONAA >90 08/19/2013 1400   GFRAA >90 08/19/2013 1400     Review of Systems     Objective:   Physical Exam There were  no vitals filed for this visit. Filed Vitals:   09/07/13 1428  Height: 6\' 4"  (1.93 m)  Weight: 218 lb 12.8 oz (99.247 kg)     Filed Vitals:   09/07/13 1428  Height: 6\' 4"  (1.93 m)  Weight: 218 lb 12.8 oz (99.247 kg)  Alert and oriented. Skin warm and dry. Oral mucosa is moist.   . Sclera anicteric, conjunctivae is pink. Thyroid not enlarged. No cervical lymphadenopathy. Lungs clear. Heart regular rate and rhythm.  Abdomen is soft. Bowel sounds are positive. No hepatomegaly. No abdominal masses felt. No tenderness.   Small area of bruising noted at injection site.         Assessment:    Hepatitis C, active. 5 weeks into treatment.    Plan:     CBC, Hepatic rfunction. Hep C RNA OV in 4 weeks with Hep C quaint, Hepatic function and CBC

## 2013-09-07 NOTE — Patient Instructions (Signed)
Labs today. OV in 4 weeks. Continue present medications.

## 2013-09-08 LAB — HEPATIC FUNCTION PANEL
AST: 37 U/L (ref 0–37)
Alkaline Phosphatase: 78 U/L (ref 39–117)
Bilirubin, Direct: 0.2 mg/dL (ref 0.0–0.3)
Indirect Bilirubin: 0.7 mg/dL (ref 0.0–0.9)
Total Bilirubin: 0.9 mg/dL (ref 0.3–1.2)

## 2013-09-08 LAB — HEPATITIS C RNA QUANTITATIVE: HCV Quantitative: NOT DETECTED IU/mL (ref <15)

## 2013-09-09 ENCOUNTER — Telehealth (INDEPENDENT_AMBULATORY_CARE_PROVIDER_SITE_OTHER): Payer: Self-pay | Admitting: *Deleted

## 2013-09-09 DIAGNOSIS — B1920 Unspecified viral hepatitis C without hepatic coma: Secondary | ICD-10-CM

## 2013-09-09 NOTE — Telephone Encounter (Signed)
.  Per Delrae Rend the patient is to have these requested labs done in 4 weeks. Noted for October 07, 2012.

## 2013-09-10 ENCOUNTER — Other Ambulatory Visit (INDEPENDENT_AMBULATORY_CARE_PROVIDER_SITE_OTHER): Payer: Self-pay | Admitting: *Deleted

## 2013-09-10 ENCOUNTER — Encounter (INDEPENDENT_AMBULATORY_CARE_PROVIDER_SITE_OTHER): Payer: Self-pay | Admitting: *Deleted

## 2013-09-10 DIAGNOSIS — B1920 Unspecified viral hepatitis C without hepatic coma: Secondary | ICD-10-CM

## 2013-10-05 ENCOUNTER — Encounter (INDEPENDENT_AMBULATORY_CARE_PROVIDER_SITE_OTHER): Payer: Self-pay | Admitting: Internal Medicine

## 2013-10-05 ENCOUNTER — Ambulatory Visit (INDEPENDENT_AMBULATORY_CARE_PROVIDER_SITE_OTHER): Payer: Self-pay | Admitting: Internal Medicine

## 2013-10-05 VITALS — BP 124/70 | HR 84 | Temp 98.0°F | Ht 76.0 in | Wt 213.5 lb

## 2013-10-05 DIAGNOSIS — B1920 Unspecified viral hepatitis C without hepatic coma: Secondary | ICD-10-CM

## 2013-10-05 LAB — CBC WITH DIFFERENTIAL/PLATELET
Basophils Absolute: 0 10*3/uL (ref 0.0–0.1)
Basophils Relative: 0 % (ref 0–1)
EOS ABS: 0.1 10*3/uL (ref 0.0–0.7)
EOS PCT: 1 % (ref 0–5)
HEMATOCRIT: 38.3 % — AB (ref 39.0–52.0)
Hemoglobin: 13.3 g/dL (ref 13.0–17.0)
LYMPHS ABS: 2 10*3/uL (ref 0.7–4.0)
LYMPHS PCT: 33 % (ref 12–46)
MCH: 32.2 pg (ref 26.0–34.0)
MCHC: 34.7 g/dL (ref 30.0–36.0)
MCV: 92.7 fL (ref 78.0–100.0)
MONO ABS: 0.4 10*3/uL (ref 0.1–1.0)
Monocytes Relative: 7 % (ref 3–12)
Neutro Abs: 3.7 10*3/uL (ref 1.7–7.7)
Neutrophils Relative %: 59 % (ref 43–77)
PLATELETS: 149 10*3/uL — AB (ref 150–400)
RBC: 4.13 MIL/uL — AB (ref 4.22–5.81)
RDW: 14.1 % (ref 11.5–15.5)
WBC: 6.2 10*3/uL (ref 4.0–10.5)

## 2013-10-05 NOTE — Patient Instructions (Addendum)
OV in 4 weeks with a CBC, CMET, and Hep C Quaint.

## 2013-10-05 NOTE — Progress Notes (Signed)
Subjective:     Patient ID: Samuel [name], male   DOB: 1956-09-04, 58 y.o.   MRN: [id number]  HPI Here today for f/u of his Hepatitis C. He was last seen December 2014. He is Genotype 1A and is being treated with Solvaldi 400mg , Robavarin 121mcg and Pegasys 163mcg weekly. Started treatment on 08/09/2013. This is week 8 for him.   He has missed 3 days of Solvaldi. He has called the drug company for this.   He feels okay. He does have flu like symptoms. Appetite is good.He has lost about 3.5 pounds since his last visit last month.  No hx of tattoos. No IV drug use. He did do IV drugs x 1.  No hx of major depression.  Diagnosed with Hepatitis C 2 yrs ago at the [facility] in [place].  He has not drank in 7 yrs.       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    CBC    Component Value Date/Time   WBC 6.1 09/07/2013 1445   RBC 3.83* 09/07/2013 1445   HGB 12.2* 09/07/2013 1445   HCT 35.1* 09/07/2013 1445   PLT 170 09/07/2013 1445   MCV 91.6 09/07/2013 1445   MCH 31.9 09/07/2013 1445   MCHC 34.8 09/07/2013 1445   RDW 15.7* 09/07/2013 1445   LYMPHSABS 2.7 09/07/2013 1445   MONOABS 0.4 09/07/2013 1445   EOSABS 0.1 09/07/2013 1445   BASOSABS 0.0 09/07/2013 1445    CMP     Component Value Date/Time   NA 135 08/19/2013 1400   K 3.6 08/19/2013 1400   CL 99 08/19/2013 1400   CO2 25 08/19/2013 1400   GLUCOSE 236* 08/19/2013 1400   BUN 14 08/19/2013 1400   CREATININE 0.58 08/19/2013 1400   CREATININE 0.58 02/10/2013 1022   CALCIUM 9.6 08/19/2013 1400   PROT 7.2 09/07/2013 1445   ALBUMIN 4.3 09/07/2013 1445   AST 37 09/07/2013 1445   ALT 46 09/07/2013 1445   ALKPHOS 78 09/07/2013 1445   BILITOT 0.9 09/07/2013 1445   GFRNONAA >90 08/19/2013 1400   GFRAA >90 08/19/2013 1400   09/07/2013: Neutro ABS: 3.6 HCV Quaint: undetectable.          Review of Systems see hpi  Past Medical  History  Diagnosis Date  . Diabetes mellitus without complication   . Hypertension   . Neuropathy   . Arthritis   . Lower back pain   . Hepatitis C   . Mental disorder   . Depression   . Anxiety   . 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 Testicular pain, left 10/23/2012  . Abdominal pain 10/23/2012   Current Outpatient Prescriptions on File Prior to Visit  Medication Sig Dispense Refill  . amLODipine (NORVASC) 5 MG tablet Take 5 mg by mouth daily.      Marland Kitchen glipiZIDE (GLUCOTROL) 10 MG tablet Take 10 mg by mouth daily.       Marland Kitchen HYDROcodone-acetaminophen (NORCO/VICODIN) 5-325 MG per tablet Take 1 tablet by mouth every 6 (six) hours as needed for moderate pain.  20 tablet  0  . ibuprofen (ADVIL,MOTRIN) 200 MG tablet Take 400 mg by mouth every 6 (six) hours as needed for mild pain or moderate pain.      Marland Kitchen lisinopril (PRINIVIL,ZESTRIL) 20 MG tablet Take 20 mg by mouth daily.      . Menthol-Methyl Salicylate (ICY HOT EXTRA STRENGTH) 10-30 % CREA  Apply 1 application topically daily as needed. For pain      . metFORMIN (GLUCOPHAGE) 500 MG tablet Take 500 mg by mouth 2 (two) times daily.      . peginterferon alfa-2a (PEGASYS) 180 MCG/ML injection Inject 180 mcg into the skin every 7 (seven) days.      . ribavirin (COPEGUS) 200 MG tablet Take 600 mg/day by mouth 2 (two) times daily.       . Sofosbuvir (SOVALDI) 400 MG TABS Take 400 mg by mouth.       No current facility-administered medications on file prior to visit.   Past Surgical History  Procedure Laterality Date  . Appendectomy      as child  . Colonoscopy with propofol  11/04/2012    ZOX:[id number] internal hemorrhoids/ Mild diverticulosis was noted in the transverse colon/ 34 COLON polyps REMOVED, tubular adenomas, needs surveillance in 1-3 years  . Esophagogastroduodenoscopy (egd) with propofol  11/04/2012    [id number] erosions at the GE junction/PATENT Stricture was found at the gastroesophageal junction/MODERATE gastritis/MODERATE DUODENITIS likely accounts  for findings on CT. next surveillance in 3 years   . Esophageal biopsy  11/04/2012    Procedure: BIOPSY;  Surgeon: Danie Binder, MD;  Location: [facility];  Service: Endoscopy;  Laterality: N/A;  gastric  . Polypectomy  11/04/2012    Procedure: POLYPECTOMY;  Surgeon: Danie Binder, MD;  Location: [facility];  Service: Endoscopy;  Laterality: N/A;   No Known Allergies     Objective:   Physical Exam There were no vitals filed for this visit. Filed Vitals:   10/05/13 1613  BP: 124/70  Pulse: 84  Temp: 98 F (36.7 C)  Height: 6\' 4"  (1.93 m)  Weight: 213 lb 8 oz (96.843 kg)  Alert and oriented. Skin warm and dry. Oral mucosa is moist.   . Sclera anicteric, conjunctivae is pink. Thyroid not enlarged. No cervical lymphadenopathy. Lungs clear. Heart regular rate and rhythm.  Abdomen is soft. Bowel sounds are positive. No hepatomegaly. No abdominal masses felt. No tenderness.  No edema to lower extremities.  Very small petechial rash to left lower abdomen from injection of Pegasys.  Slight rash to palm of left hand.        Assessment:    Hepatitis C. He has cleared the virus. Quaint is undetectable    Plan:    CBC, CMET, and Hep C quaint today  OV in 4 weeks with a Hep C quaint., CBC, and cmet.

## 2013-10-06 LAB — COMPREHENSIVE METABOLIC PANEL
ALT: 77 U/L — AB (ref 0–53)
AST: 56 U/L — ABNORMAL HIGH (ref 0–37)
Albumin: 4.3 g/dL (ref 3.5–5.2)
Alkaline Phosphatase: 88 U/L (ref 39–117)
BILIRUBIN TOTAL: 1.1 mg/dL (ref 0.3–1.2)
BUN: 15 mg/dL (ref 6–23)
CALCIUM: 9.9 mg/dL (ref 8.4–10.5)
CHLORIDE: 99 meq/L (ref 96–112)
CO2: 26 meq/L (ref 19–32)
CREATININE: 0.8 mg/dL (ref 0.50–1.35)
GLUCOSE: 281 mg/dL — AB (ref 70–99)
Potassium: 3.8 mEq/L (ref 3.5–5.3)
Sodium: 135 mEq/L (ref 135–145)
Total Protein: 7 g/dL (ref 6.0–8.3)

## 2013-10-07 ENCOUNTER — Other Ambulatory Visit (INDEPENDENT_AMBULATORY_CARE_PROVIDER_SITE_OTHER): Payer: Self-pay | Admitting: Internal Medicine

## 2013-10-07 ENCOUNTER — Telehealth (INDEPENDENT_AMBULATORY_CARE_PROVIDER_SITE_OTHER): Payer: Self-pay | Admitting: Internal Medicine

## 2013-10-07 DIAGNOSIS — E119 Type 2 diabetes mellitus without complications: Secondary | ICD-10-CM

## 2013-10-07 LAB — HEPATITIS C RNA QUANTITATIVE: HCV Quantitative: NOT DETECTED IU/mL (ref <15)

## 2013-10-07 MED ORDER — GLIPIZIDE 10 MG PO TABS: 10.0000 mg | ORAL_TABLET | Freq: Every day | ORAL | Status: DC

## 2013-10-07 MED ORDER — LISINOPRIL 20 MG PO TABS: 20.0000 mg | ORAL_TABLET | Freq: Every day | ORAL | Status: DC

## 2013-10-07 NOTE — Telephone Encounter (Signed)
No answer at home #

## 2013-10-29 ENCOUNTER — Encounter (INDEPENDENT_AMBULATORY_CARE_PROVIDER_SITE_OTHER): Payer: Self-pay | Admitting: *Deleted

## 2013-10-29 ENCOUNTER — Telehealth (INDEPENDENT_AMBULATORY_CARE_PROVIDER_SITE_OTHER): Payer: Self-pay | Admitting: *Deleted

## 2013-10-29 DIAGNOSIS — B1920 Unspecified viral hepatitis C without hepatic coma: Secondary | ICD-10-CM

## 2013-10-29 NOTE — Telephone Encounter (Signed)
.  Per Lelon Perla , patient is to have lab work in 4 weeks from 10/05/13. This would be 11/02/13.

## 2013-11-02 ENCOUNTER — Encounter (INDEPENDENT_AMBULATORY_CARE_PROVIDER_SITE_OTHER): Payer: Self-pay | Admitting: Internal Medicine

## 2013-11-02 ENCOUNTER — Ambulatory Visit (INDEPENDENT_AMBULATORY_CARE_PROVIDER_SITE_OTHER): Payer: Self-pay | Admitting: Internal Medicine

## 2013-11-02 VITALS — BP 140/72 | HR 84 | Temp 98.6°F | Ht 76.0 in | Wt 216.3 lb

## 2013-11-02 DIAGNOSIS — R894 Abnormal immunological findings in specimens from other organs, systems and tissues: Secondary | ICD-10-CM

## 2013-11-02 DIAGNOSIS — R768 Other specified abnormal immunological findings in serum: Secondary | ICD-10-CM

## 2013-11-02 LAB — CBC
HEMATOCRIT: 42 % (ref 39.0–52.0)
HEMOGLOBIN: 14.6 g/dL (ref 13.0–17.0)
MCH: 33.4 pg (ref 26.0–34.0)
MCHC: 34.8 g/dL (ref 30.0–36.0)
MCV: 96.1 fL (ref 78.0–100.0)
Platelets: 154 10*3/uL (ref 150–400)
RBC: 4.37 MIL/uL (ref 4.22–5.81)
RDW: 13.5 % (ref 11.5–15.5)
WBC: 6.6 10*3/uL (ref 4.0–10.5)

## 2013-11-02 NOTE — Patient Instructions (Signed)
Please do not drink. OV in 4 months with Dr. Laural Golden. CBC, Hep C Quaint, Cmet, and a TSH

## 2013-11-02 NOTE — Progress Notes (Signed)
Subjective:     Patient ID: Samuel [name], male   DOB: [birth year], 58 y.o.   MRN: [id number]  HPI Here today for f/u of his Hepatitis C. He is Genotype 1a and is being treated with Solvaldi 400mg , Ribovirin 1266mcg and Pegasys 190mcg weekly. 10/05/2013 His Hep C Quaint was undetectable.  He was last seen 10/05/2013. He finished treatment 2 Sundays ago.  He started treatment 08/08/2013. No hx of tattoos. No IV drug use. He did do IV drugs x 1 in the past.  No hx of major depression. Diagnosed with Hepatitis C about 2 yrs ago at the [facility] in [place], [place]. He has not drank in over 36yrs.  Noticed rash to both sides of his abdomen x 5 weeks ago.  He states it is clearing up. He has a rash to soles of each foot.  Appetite is good. He has gained 3 pounds since his visit 10/05/2013. No abdominal pain. She usually has a BM daily. No melena or bright red rectal bleeding.   10/05/2013 ANC 3.7  CBC    Component Value Date/Time   WBC 6.2 10/05/2013 1556   RBC 4.13* 10/05/2013 1556   HGB 13.3 10/05/2013 1556   HCT 38.3* 10/05/2013 1556   PLT 149* 10/05/2013 1556   MCV 92.7 10/05/2013 1556   MCH 32.2 10/05/2013 1556   MCHC 34.7 10/05/2013 1556   RDW 14.1 10/05/2013 1556   LYMPHSABS 2.0 10/05/2013 1556   MONOABS 0.4 10/05/2013 1556   EOSABS 0.1 10/05/2013 1556   BASOSABS 0.0 10/05/2013 1556        Review of Systems  See hpi Current Outpatient Prescriptions  Medication Sig Dispense Refill  . amLODipine (NORVASC) 5 MG tablet Take 5 mg by mouth daily.      Marland Kitchen glipiZIDE (GLUCOTROL) 10 MG tablet Take 20 mg by mouth daily.      Marland Kitchen ibuprofen (ADVIL,MOTRIN) 200 MG tablet Take 400 mg by mouth every 6 (six) hours as needed for mild pain or moderate pain.      Marland Kitchen lisinopril (PRINIVIL,ZESTRIL) 20 MG tablet Take 1 tablet (20 mg total) by mouth daily.  30 tablet  3  . metFORMIN (GLUCOPHAGE) 500 MG tablet Take 1,000 mg by mouth 2 (two) times daily.       . peginterferon alfa-2a (PEGASYS) 180 MCG/ML injection Inject 180 mcg into the  skin every 7 (seven) days.      . ribavirin (COPEGUS) 200 MG tablet Take 600 mg/day by mouth 2 (two) times daily.       . Sofosbuvir (SOVALDI) 400 MG TABS Take 400 mg by mouth.       No current facility-administered medications for this visit.   Past Medical History  Diagnosis Date  . Diabetes mellitus without complication   . Hypertension   . Neuropathy   . Arthritis   . Lower back pain   . Hepatitis C   . Mental disorder   . Depression   . Anxiety   . 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 Testicular pain, left 10/23/2012  . Abdominal pain 10/23/2012   Past Surgical History  Procedure Laterality Date  . Appendectomy      as child  . Colonoscopy with propofol  11/04/2012    YNW:GNFAO internal hemorrhoids/ Mild diverticulosis was noted in the transverse colon/ 34 COLON polyps REMOVED, tubular adenomas, needs surveillance in 1-3 years  . Esophagogastroduodenoscopy (egd) with propofol  11/04/2012    ZHY:QMVHQI erosions at the GE junction/PATENT Stricture was found at the  gastroesophageal junction/MODERATE gastritis/MODERATE DUODENITIS likely accounts for findings on CT. next surveillance in 3 years   . Esophageal biopsy  11/04/2012    Procedure: BIOPSY;  Surgeon: Danie Binder, MD;  Location: [facility];  Service: Endoscopy;  Laterality: N/A;  gastric  . Polypectomy  11/04/2012    Procedure: POLYPECTOMY;  Surgeon: Danie Binder, MD;  Location: [facility];  Service: Endoscopy;  Laterality: N/A;   No Known Allergies      Objective:   Physical Exam  Filed Vitals:   11/02/13 1542  BP: 140/72  Pulse: 84  Temp: 98.6 F (37 C)  Height: 6\' 4"  (1.93 m)  Weight: 216 lb 4.8 oz (98.113 kg)  Alert and oriented. Skin warm and dry. Oral mucosa is moist.   . Sclera anicteric, conjunctivae is pink. Thyroid not enlarged. No cervical lymphadenopathy. Lungs clear. Heart regular rate and rhythm.  Abdomen is soft. Bowel sounds are positive. No hepatomegaly. No abdominal masses felt. No tenderness.  No edema to lower  extremities. Drying red rash to rt and left abdomen. Rash to soles of both feet.       Assessment:    Hepatitis C. He has cleared the virus. He is doing well.     Plan:     OV in 4 months with Dr. Laural Golden..  CBC, CMET, Hep C quaint, TSH today.  Labs in 4 months: CBC, CMET, and Hep C quaint.

## 2013-11-03 LAB — COMPREHENSIVE METABOLIC PANEL
ALBUMIN: 4.2 g/dL (ref 3.5–5.2)
ALT: 103 U/L — ABNORMAL HIGH (ref 0–53)
AST: 67 U/L — AB (ref 0–37)
Alkaline Phosphatase: 80 U/L (ref 39–117)
BUN: 14 mg/dL (ref 6–23)
CALCIUM: 9.7 mg/dL (ref 8.4–10.5)
CHLORIDE: 102 meq/L (ref 96–112)
CO2: 28 meq/L (ref 19–32)
CREATININE: 0.63 mg/dL (ref 0.50–1.35)
GLUCOSE: 288 mg/dL — AB (ref 70–99)
POTASSIUM: 4.1 meq/L (ref 3.5–5.3)
Sodium: 138 mEq/L (ref 135–145)
Total Bilirubin: 0.5 mg/dL (ref 0.2–1.2)
Total Protein: 7.2 g/dL (ref 6.0–8.3)

## 2013-11-03 LAB — HEPATITIS C RNA QUANTITATIVE: HCV QUANT: NOT DETECTED [IU]/mL (ref <15)

## 2013-11-03 LAB — TSH: TSH: 1.813 u[IU]/mL (ref 0.350–4.500)

## 2013-11-06 ENCOUNTER — Telehealth (INDEPENDENT_AMBULATORY_CARE_PROVIDER_SITE_OTHER): Payer: Self-pay | Admitting: *Deleted

## 2013-11-06 DIAGNOSIS — B1920 Unspecified viral hepatitis C without hepatic coma: Secondary | ICD-10-CM

## 2013-11-06 NOTE — Telephone Encounter (Signed)
.  Per Terri Setzer,NP patient to have lab work in 6 months. 

## 2013-11-10 ENCOUNTER — Other Ambulatory Visit (HOSPITAL_COMMUNITY): Payer: Self-pay | Admitting: Gastroenterology

## 2014-02-19 ENCOUNTER — Other Ambulatory Visit (INDEPENDENT_AMBULATORY_CARE_PROVIDER_SITE_OTHER): Payer: Self-pay | Admitting: Internal Medicine

## 2014-02-23 ENCOUNTER — Other Ambulatory Visit (INDEPENDENT_AMBULATORY_CARE_PROVIDER_SITE_OTHER): Payer: Self-pay | Admitting: Internal Medicine

## 2014-02-23 DIAGNOSIS — E119 Type 2 diabetes mellitus without complications: Secondary | ICD-10-CM

## 2014-02-23 MED ORDER — LISINOPRIL 20 MG PO TABS: 20.0000 mg | ORAL_TABLET | Freq: Every day | ORAL | Status: AC

## 2014-03-02 ENCOUNTER — Ambulatory Visit (INDEPENDENT_AMBULATORY_CARE_PROVIDER_SITE_OTHER): Payer: Medicaid - Out of State | Admitting: Internal Medicine

## 2014-03-05 ENCOUNTER — Encounter (INDEPENDENT_AMBULATORY_CARE_PROVIDER_SITE_OTHER): Payer: Self-pay | Admitting: *Deleted

## 2014-04-09 IMAGING — CT CT ABD-PELV W/ CM
2 of 5 series · 17 of 46 positions shown, 19 images · IV contrast (Omnipaque 300)
Comparison: 10/08/2012.

CLINICAL DATA: Left lower quadrant pain.Follow-up duodenitis.

CT ABDOMEN AND PELVIS WITH CONTRAST
TECHNIQUE: Multidetector CT imaging of the abdomen and pelvis was
performed following the standard protocol during bolus
administration of intravenous contrast.
Contrast: 100mL OMNIPAQUE IOHEXOL 300 MG/ML  SOLN

[Series 2: abd_pel_with 5.0 b40f · axial · 0.83mm/px · z∈[-538,-128]mm · 14 of 94 slices shown, 16 images]
[im 6/94  soft-tissue]
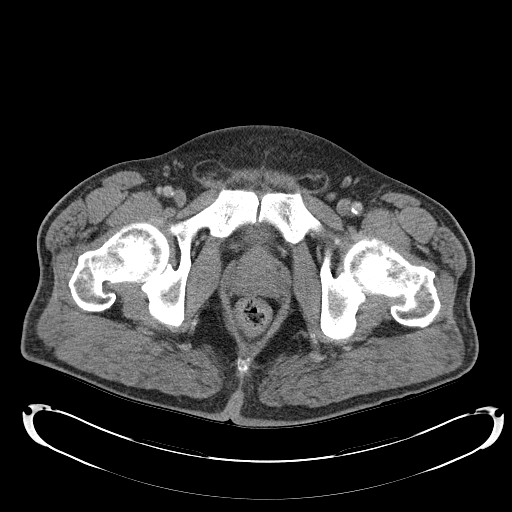
[im 6/94  bone]
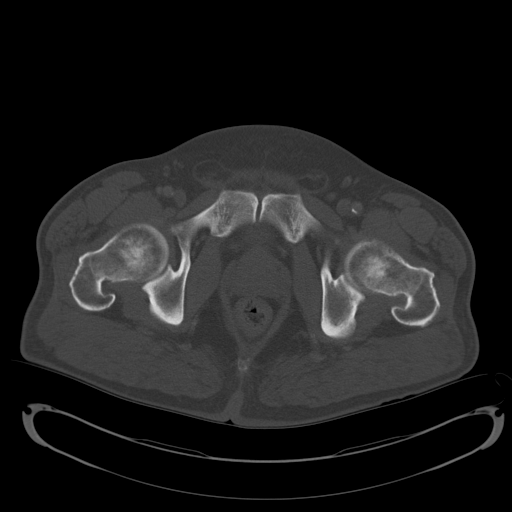
[im 11/94  soft-tissue]
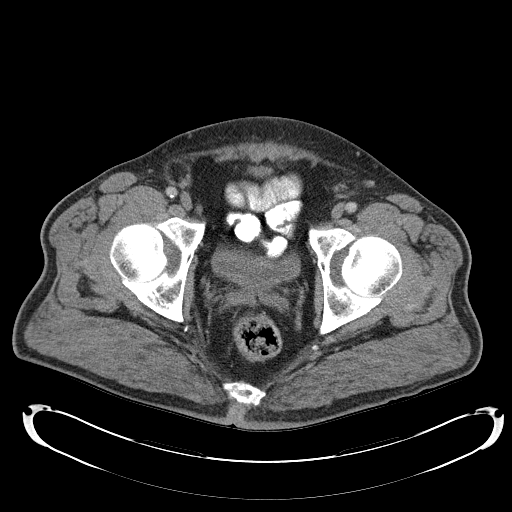
[im 21/94  soft-tissue]
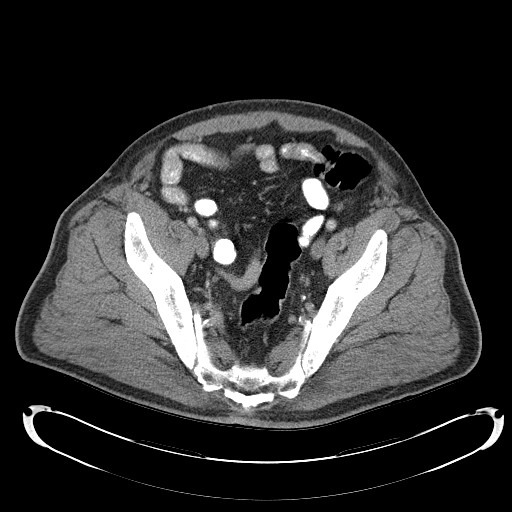
[im 26/94  soft-tissue]
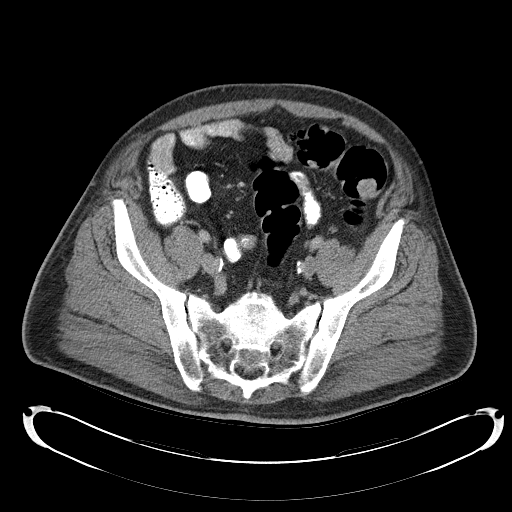
[im 32/94  soft-tissue]
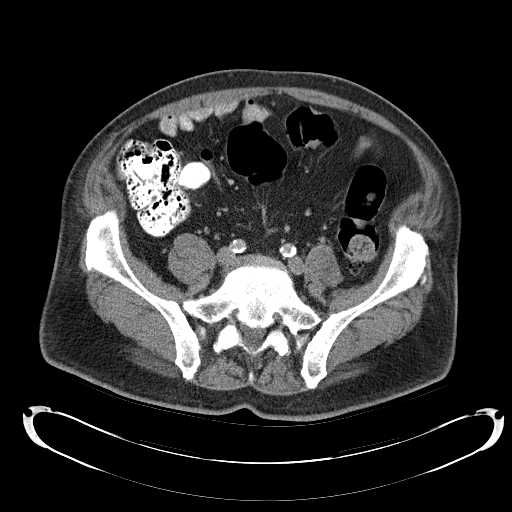
[im 37/94  soft-tissue]
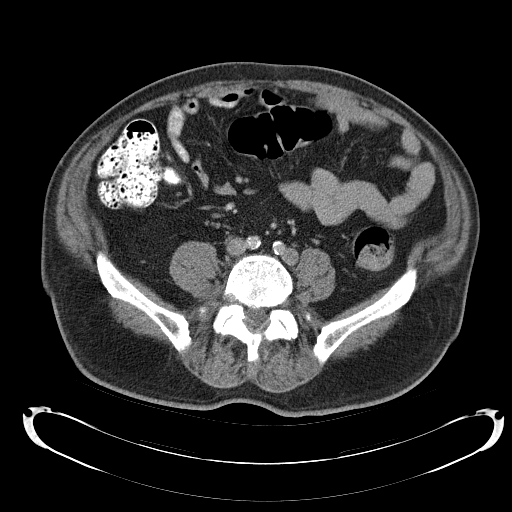
[im 42/94  soft-tissue]
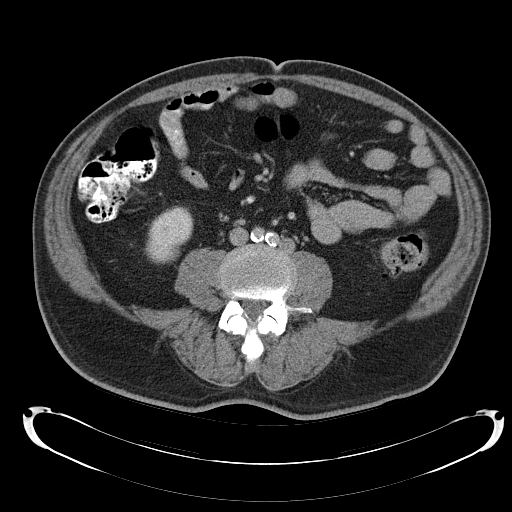
[im 52/94  soft-tissue]
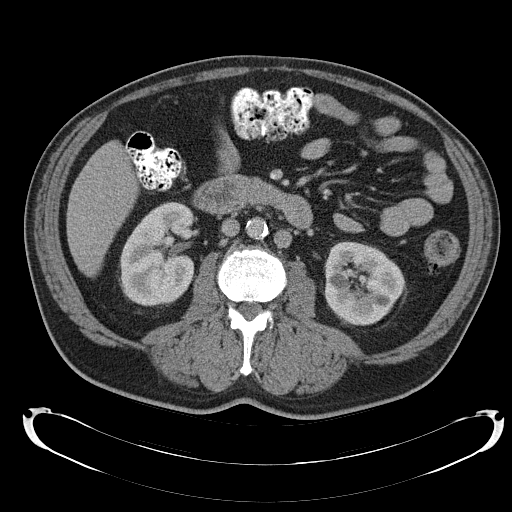
[im 57/94  soft-tissue]
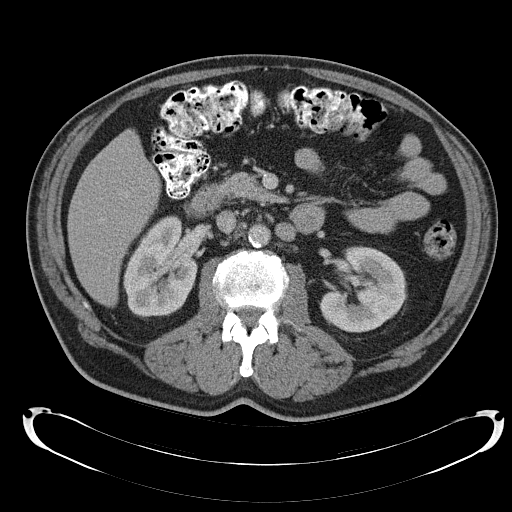
[im 57/94  bone]
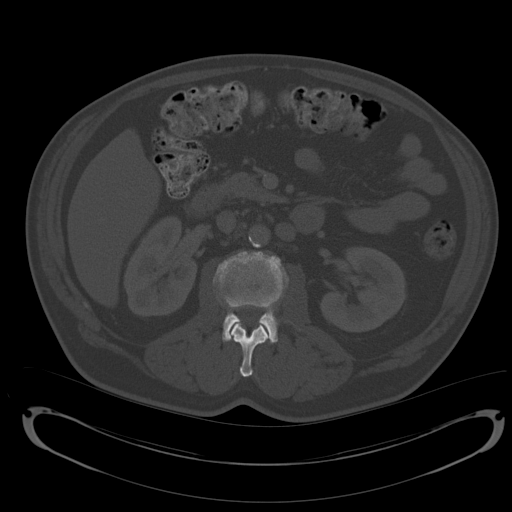
[im 63/94  soft-tissue]
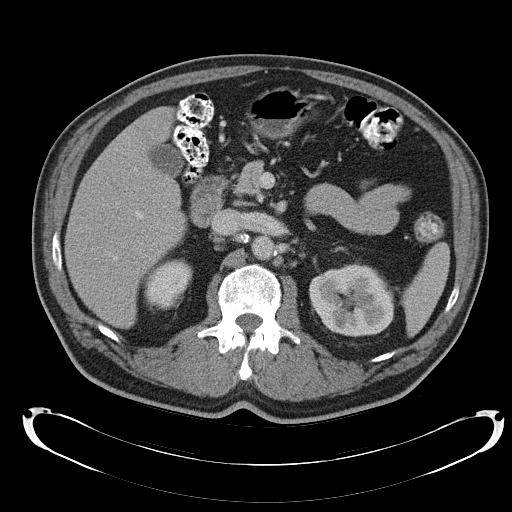
[im 68/94  soft-tissue]
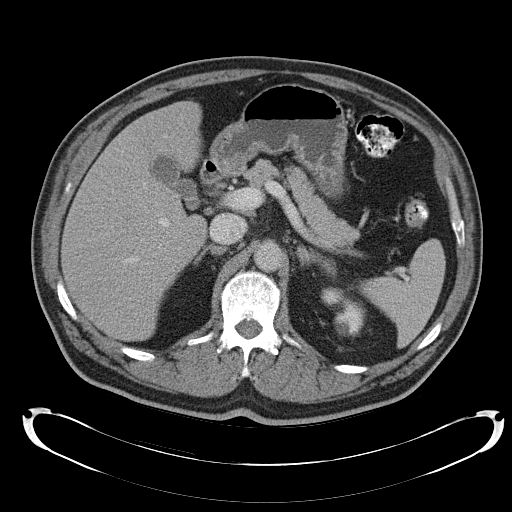
[im 73/94  soft-tissue]
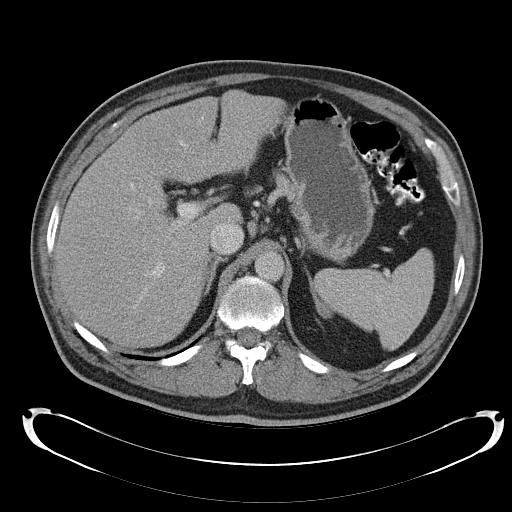
[im 83/94  soft-tissue]
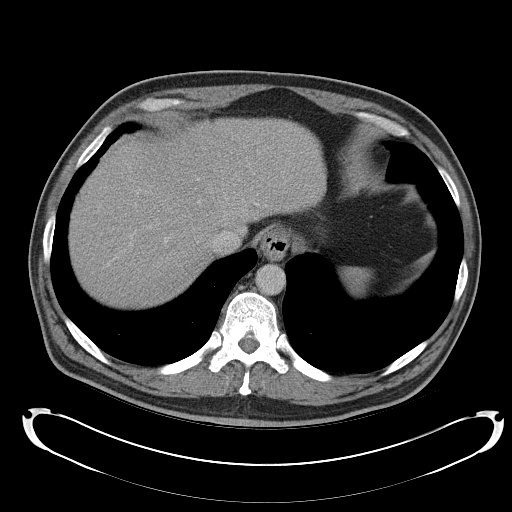
[im 88/94  soft-tissue]
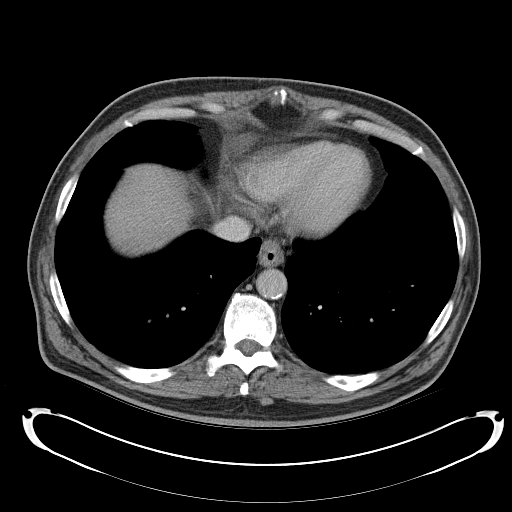

[Series 4: abd_pel_with 3.0 spo cor · coronal · 0.81mm/px · 3 of 93 slices shown]
[im 31/93  soft-tissue]
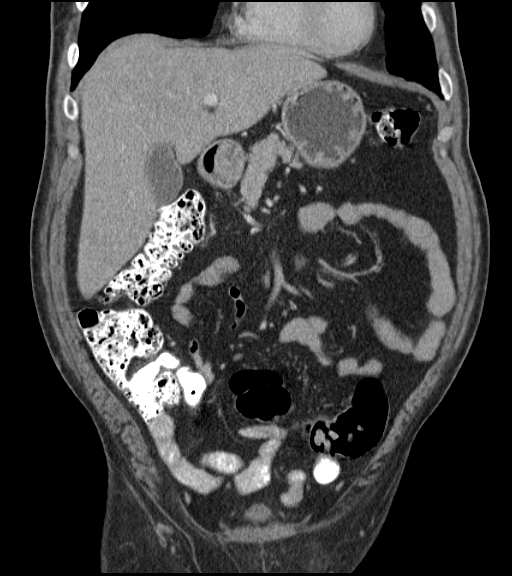
[im 41/93  soft-tissue]
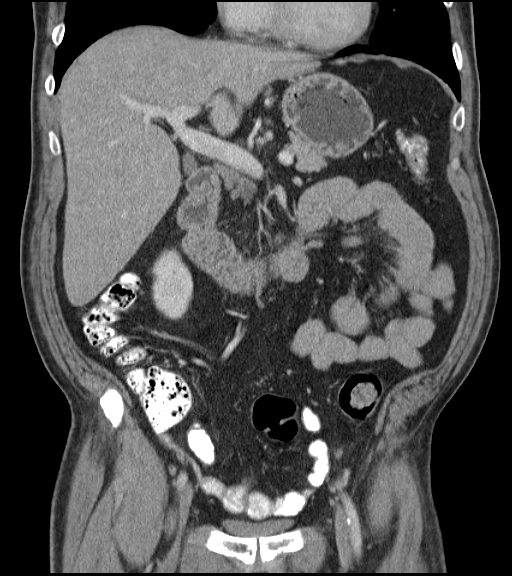
[im 52/93  soft-tissue]
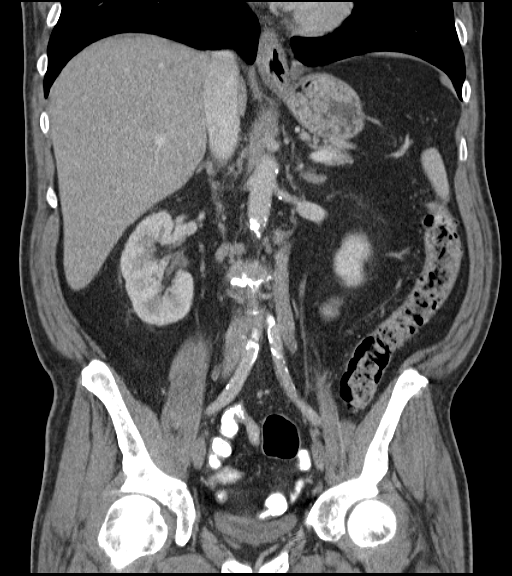

[17 of 46 positions shown; findings below may reference images not displayed]

FINDINGS: Tiny hiatal hernia again noted.  The liver, spleen,
pancreas, and adrenal glands are normal appearance.  Small renal
cysts are again seen bilaterally, however there is no evidence of
renal masses or hydronephrosis.

No soft tissue masses or lymphadenopathy identified.  Duplicated
IVC again noted.  Previously noted wall thickening of the
transverse duodenum is no longer visualized.  No other inflammatory
process or abnormal fluid collections are identified.
Diverticulosis is again seen are normal involving the left colon,
however there is no evidence of diverticulitis.  No evidence of
bowel wall thickening, dilatation, or hernia.
IMPRESSION: 1.  Resolution of the wall thickening since previous study.  No
acute findings.
2.  Diverticulosis.  No radiographic evidence of diverticulitis.
3.  Stable tiny hiatal hernia.

## 2014-04-21 ENCOUNTER — Other Ambulatory Visit (INDEPENDENT_AMBULATORY_CARE_PROVIDER_SITE_OTHER): Payer: Self-pay | Admitting: *Deleted

## 2014-04-21 ENCOUNTER — Encounter (INDEPENDENT_AMBULATORY_CARE_PROVIDER_SITE_OTHER): Payer: Self-pay | Admitting: *Deleted

## 2014-04-21 DIAGNOSIS — B1920 Unspecified viral hepatitis C without hepatic coma: Secondary | ICD-10-CM

## 2014-11-27 ENCOUNTER — Other Ambulatory Visit: Payer: Self-pay | Admitting: Gastroenterology
